# Patient Record
Sex: Male | Born: 1970 | Race: White | Hispanic: No | Marital: Single | Smoking: Never smoker
Health system: Southern US, Community
[De-identification: ages and names within clinical notes are randomized; demographics above are authoritative.]

## PROBLEM LIST (undated history)

## (undated) DIAGNOSIS — I10 Essential (primary) hypertension: Secondary | ICD-10-CM

## (undated) DIAGNOSIS — I2510 Atherosclerotic heart disease of native coronary artery without angina pectoris: Secondary | ICD-10-CM

## (undated) DIAGNOSIS — E785 Hyperlipidemia, unspecified: Secondary | ICD-10-CM

## (undated) HISTORY — PX: NASAL SEPTUM SURGERY: SHX37

## (undated) HISTORY — DX: Hyperlipidemia, unspecified: E78.5

## (undated) HISTORY — DX: Essential (primary) hypertension: I10

## (undated) HISTORY — PX: FRACTURE SURGERY: SHX138

---

## 2000-08-28 ENCOUNTER — Encounter: Payer: Self-pay | Admitting: Family Medicine

## 2000-08-28 ENCOUNTER — Encounter: Admission: RE | Admit: 2000-08-28 | Discharge: 2000-08-28 | Payer: Self-pay | Admitting: Family Medicine

## 2013-12-30 ENCOUNTER — Ambulatory Visit: Payer: Self-pay | Admitting: Cardiology

## 2014-01-16 ENCOUNTER — Encounter: Payer: Self-pay | Admitting: Cardiology

## 2014-01-16 ENCOUNTER — Encounter (INDEPENDENT_AMBULATORY_CARE_PROVIDER_SITE_OTHER): Payer: Self-pay

## 2014-01-16 ENCOUNTER — Ambulatory Visit (INDEPENDENT_AMBULATORY_CARE_PROVIDER_SITE_OTHER): Payer: Managed Care, Other (non HMO) | Admitting: Cardiology

## 2014-01-16 VITALS — BP 138/80 | HR 97 | Ht 66.0 in | Wt 155.0 lb

## 2014-01-16 DIAGNOSIS — I10 Essential (primary) hypertension: Secondary | ICD-10-CM

## 2014-01-16 NOTE — Patient Instructions (Signed)
Your physician recommends that you continue on your current medications as directed. Please refer to the Current Medication list given to you today.  Your physician recommends that you schedule a follow-up appointment in: 3 months follow-up with Dr. Anne FuSkains

## 2014-01-16 NOTE — Progress Notes (Signed)
[address] [place], [place]  [address] Phone: [phone] Fax:  [phone]  Date:  01/16/2014   ID:  Gary Castaneda, DOB May 01, 1971, MRN [id number]  PCP:  No primary provider on file.   History of Present Illness: Gary Castaneda is a 43 y.o. male patient of Carleene Overlie here for evaluation of difficult to control hypertension. He has been on several medications in the past including ACE inhibitors, diuretics he is no history of renal disease, coarctation, hyperaldosteronism, pheochromocytoma. I have been discussing his case with his primary provider. We decided to add Norvasc. Blood pressure has been severely elevated 216/102. His father had myocardial infarction at age 27. Stressful job., 3 children. Males die at age 41 he states. Has mild rash on back, torso, legs.   Bystolic - leg cramps.     Wt Readings from Last 3 Encounters:  01/16/14 155 lb (70.308 kg)     Past Medical History  Diagnosis Date  . HTN (hypertension)   . Hyperlipidemia LDL goal < 100     No past surgical history on file.  Current Outpatient Prescriptions  Medication Sig Dispense Refill  . amLODipine (NORVASC) 10 MG tablet Take 10 mg by mouth daily.      Marland Kitchen atorvastatin (LIPITOR) 10 MG tablet Take 10 mg by mouth daily.      Marland Kitchen lisinopril-hydrochlorothiazide (PRINZIDE,ZESTORETIC) 20-25 MG per tablet Take 1 tablet by mouth daily.      Marland Kitchen spironolactone (ALDACTONE) 25 MG tablet Take 25 mg by mouth daily.       No current facility-administered medications for this visit.    Allergies:   No Known Allergies  Social History:  The patient  reports that he has never smoked. He does not have any smokeless tobacco history on file. He reports that he does not drink alcohol or use illicit drugs. drinks on average 2-3 drinks daily. Works as a Dietitian. Grew up with Dani Gobble.  Family History  Problem Relation Age of Onset  . Hypertension    . Diabetes     . Kidney disease Father   . Heart attack    . Stroke      ROS:  Please see the history of present illness.   Denies any strokelike symptoms, fevers, syncope, severe headaches, visual disturbance, shortness of breath, chest pain, orthopnea, PND. Positive rash. Has a history of psoriasis.  All other systems reviewed and negative.   PHYSICAL EXAM: VS:  BP 138/80  Pulse 97  Ht 5\' 6"  (1.676 m)  Wt 155 lb (70.308 kg)  BMI 25.03 kg/m2 Well nourished, well developed, in no acute distress HEENT: normal, [place]/AT, EOMI Neck: no JVD, normal carotid upstroke, no bruit Cardiac:  normal S1, S2; RRR; no murmur Lungs:  clear to auscultation bilaterally, no wheezing, rhonchi or rales Abd: soft, nontender, no hepatomegaly, no bruits Ext: no edema, 2+ distal pulses Skin: warm and drypatchy rash.  GU: deferred Neuro: no focal abnormalities noted, AAO x 3  EKG:  Sinus rhythm, rate 97 no other abnormalities.     ASSESSMENT AND PLAN:  1. Uncontrolled hypertension-obviously uncontrolled hypertension put him at risk for stroke, heart attack, renal impairment. Difficult to control. His father has a history of myocardial infarction. Currently, blood pressure is improved after addition of amlodipine and spironolactone. Excellent. Continue with current plan. History does not provide enough evidence that this is pheochromocytoma which is very rare. Also, since blood pressure is  controlled, I do not feel strongly at this time that a renal duplex ultrasound is necessary. If we begin to see elevated blood pressures once again, I will have low threshold for ordering renal duplex. Continue with low-salt diet. Exercise. Stress reduction. 2. I will see him back in 3 months for followup. We will continue to closely monitor. He states that he did his blood pressure checked periodically at [facility].  Signed, Donato SchultzMark Omran Keelin, MD [facility]  01/16/2014 11:44 AM

## 2014-04-15 ENCOUNTER — Ambulatory Visit (INDEPENDENT_AMBULATORY_CARE_PROVIDER_SITE_OTHER): Payer: Managed Care, Other (non HMO) | Admitting: Cardiology

## 2014-04-15 ENCOUNTER — Encounter: Payer: Self-pay | Admitting: Cardiology

## 2014-04-15 VITALS — BP 124/76 | HR 100 | Ht 66.0 in | Wt 163.8 lb

## 2014-04-15 DIAGNOSIS — I10 Essential (primary) hypertension: Secondary | ICD-10-CM

## 2014-04-15 NOTE — Patient Instructions (Signed)
Your physician recommends that you continue on your current medications as directed. Please refer to the Current Medication list given to you today.  Your physician wants you to follow-up in: 1 year. You will receive a reminder letter in the mail two months in advance. If you don't receive a letter, please call our office to schedule the follow-up appointment.  

## 2014-04-15 NOTE — Progress Notes (Signed)
[address] [place], [place]  [address] Phone: [phone] Fax:  [phone]  Date:  04/15/2014   ID:  Gary MaiBrian E Castaneda, DOB 24-Mar-1971, MRN [id number]  PCP:  Teena IraniSPENCER,[name], PA-C   History of Present Illness: Gary Castaneda is a 43 y.o. male patient of Carleene OverlieSara Carter [name]-PA here for follow up of difficult to control hypertension currently doing much better. Norvasc. On spironolactone, ACE inhibitor, HCTZ.  Blood pressure has been severely elevated 216/102 in the past. His father had myocardial infarction at age 41. Stressful job., 3 children. Males die at age 41 he states. Has mild rash on back, torso, legs.   Bystolic - leg cramps.  Wt Readings from Last 3 Encounters:  04/15/14 163 lb 12.8 oz (74.299 kg)  01/16/14 155 lb (70.308 kg)     Past Medical History  Diagnosis Date  . HTN (hypertension)   . Hyperlipidemia LDL goal < 100     No past surgical history on file.  Current Outpatient Prescriptions  Medication Sig Dispense Refill  . amLODipine (NORVASC) 10 MG tablet Take 10 mg by mouth daily.      Marland Kitchen. atorvastatin (LIPITOR) 10 MG tablet Take 10 mg by mouth daily.      Marland Kitchen. lisinopril-hydrochlorothiazide (PRINZIDE,ZESTORETIC) 20-25 MG per tablet Take 1 tablet by mouth daily.      Marland Kitchen. spironolactone (ALDACTONE) 25 MG tablet Take 25 mg by mouth daily.       No current facility-administered medications for this visit.    Allergies:   No Known Allergies  Social History:  The patient  reports that he has never smoked. He does not have any smokeless tobacco history on file. He reports that he does not drink alcohol or use illicit drugs. drinks on average 2-3 drinks daily. Works as a Dietitianfinancial advisor/broker. Grew up with [name].  Family History  Problem Relation Age of Onset  . Hypertension    . Diabetes    . Kidney disease Father   . Heart attack    . Stroke      ROS:  Please see the history of present illness.   Denies any strokelike  symptoms, fevers, syncope, severe headaches, visual disturbance, shortness of breath, chest pain, orthopnea, PND. Positive rash. Has a history of psoriasis.  All other systems reviewed and negative.   PHYSICAL EXAM: VS:  BP 124/76  Pulse 100  Ht 5\' 6"  (1.676 m)  Wt 163 lb 12.8 oz (74.299 kg)  BMI 26.45 kg/m2 Well nourished, well developed, in no acute distress HEENT: normal, [place]/AT, EOMI Neck: no JVD, normal carotid upstroke, no bruit Cardiac:  normal S1, S2; RRR; no murmur Lungs:  clear to auscultation bilaterally, no wheezing, rhonchi or rales Abd: soft, nontender, no hepatomegaly, no bruits Ext: no edema, 2+ distal pulses Skin: warm and drypatchy rash.  GU: deferred Neuro: no focal abnormalities noted, AAO x 3  EKG:  Sinus rhythm, rate 97 no other abnormalities.     ASSESSMENT AND PLAN:  1. Uncontrolled hypertension-He is much improved currently. Doing very well on current regimen as above. No changes made. Spironolactone may have been the ultimate reason for further reduction. I do one him to check blood work to make sure that his renal function, potassium are within normal range. If blood pressure remains excellent, one could consider continuation of current medication regimen or potentially cutting amlodipine in half. Continue to monitor. Obviously uncontrolled hypertension put him at risk for stroke, heart  attack, renal impairment. Previously difficult to control. His father has a history of myocardial infarction. Currently, blood pressure is improved after addition of amlodipine and spironolactone. Excellent. Continue with current plan. History does not provide enough evidence that this is pheochromocytoma which is very rare. Also, since blood pressure is controlled, I do not feel strongly at this time that a renal duplex ultrasound is necessary. If we begin to see elevated blood pressures once again, I will have low threshold for ordering renal duplex. Continue with low-salt diet.  Exercise. Stress reduction. 2. I will see him back in 3 months for followup. We will continue to closely monitor. He states that he did his blood pressure checked periodically at [employer] house.  Signed, Donato SchultzMark Freja Faro, MD [facility]  04/15/2014 10:48 AM

## 2017-01-08 ENCOUNTER — Telehealth: Payer: Self-pay

## 2017-01-08 NOTE — Telephone Encounter (Signed)
SENT NOTES TO SCHEDULING 

## 2017-04-11 ENCOUNTER — Encounter: Payer: Self-pay | Admitting: Cardiology

## 2017-04-11 ENCOUNTER — Encounter (INDEPENDENT_AMBULATORY_CARE_PROVIDER_SITE_OTHER): Payer: Self-pay

## 2017-04-11 ENCOUNTER — Ambulatory Visit (INDEPENDENT_AMBULATORY_CARE_PROVIDER_SITE_OTHER): Payer: BLUE CROSS/BLUE SHIELD | Admitting: Cardiology

## 2017-04-11 VITALS — BP 142/90 | HR 88 | Ht 66.0 in | Wt 172.6 lb

## 2017-04-11 DIAGNOSIS — I10 Essential (primary) hypertension: Secondary | ICD-10-CM

## 2017-04-11 MED ORDER — NEBIVOLOL HCL 5 MG PO TABS: 5.0000 mg | ORAL_TABLET | Freq: Every day | ORAL | 11 refills | Status: DC

## 2017-04-11 MED ORDER — NEBIVOLOL HCL 5 MG PO TABS: 5.0000 mg | ORAL_TABLET | Freq: Every day | ORAL | 3 refills | Status: DC

## 2017-04-11 NOTE — Patient Instructions (Signed)
Medication Instructions:  START Bystolic 5 mg once daily   Labwork: None Ordered   Testing/Procedures: None Ordered   Follow-Up: Your physician wants you to follow-up in: 6 months with Dr. Anne FuSkains.  You will receive a reminder letter in the mail two months in advance. If you don't receive a letter, please call our office to schedule the follow-up appointment.   If you need a refill on your cardiac medications before your next appointment, please call your pharmacy.   Thank you for choosing [facility]! Eligha BridegroomMichelle Gwynneth Fabio, RN [phone]

## 2017-04-11 NOTE — Progress Notes (Signed)
[address] [place], [place]  [address] Phone: [phone] Fax:  [phone]  Date:  04/11/2017   ID:  Gary Castaneda, DOB Sep 18, 1971, MRN [id number]  PCP:  Teena Irani, PA-C   History of Present Illness: Gary Castaneda is a 46 y.o. male patient of Carleene Overlie here for follow up of difficult to control hypertension currently doing much better. Norvasc. On spironolactone, ACE inhibitor, HCTZ.  Blood pressure has been severely elevated 216/102 in the past. His father had myocardial infarction at age 57. Recently died in his 39s. Stressful job., 3 children. Males die at age 95 he states.   Father died in 2024/10/192024/02/20 - edema in leg, amlodipine 10 to 5 improved. Felt terrible, no energy, tired. 160/90. May 4 - went off spirolactone. Stopped. 140/90. Feeling a little bit better.  We will try Bystolic.  Wt Readings from Last 3 Encounters:  04/11/17 172 lb 9.6 oz (78.3 kg)  04/15/14 163 lb 12.8 oz (74.3 kg)  01/16/14 155 lb (70.3 kg)     Past Medical History:  Diagnosis Date  . HTN (hypertension)   . Hyperlipidemia LDL goal < 100     No past surgical history on file.  Current Outpatient Prescriptions  Medication Sig Dispense Refill  . amLODipine (NORVASC) 5 MG tablet Take 5 mg by mouth daily.    Marland Kitchen lisinopril-hydrochlorothiazide (PRINZIDE,ZESTORETIC) 20-25 MG per tablet Take 1 tablet by mouth daily.    . rosuvastatin (CRESTOR) 10 MG tablet Take 10 mg by mouth daily.    . nebivolol (BYSTOLIC) 5 MG tablet Take 1 tablet (5 mg total) by mouth daily. 90 tablet 3   No current facility-administered medications for this visit.     Allergies:   No Known Allergies  Social History:  The patient  reports that he has never smoked. He has never used smokeless tobacco. He reports that he does not drink alcohol or use drugs. drinks on average 2-3 drinks daily. Works as a Dietitian. Grew up with Dani Gobble.  Family History    Problem Relation Age of Onset  . Hypertension Unknown   . Diabetes Unknown   . Kidney disease Father   . Heart attack Unknown   . Stroke Unknown     ROS:  Please see the history of present illness.   Denies any strokelike symptoms, fevers, syncope, severe headaches, visual disturbance, shortness of breath, chest pain, orthopnea, PND. Positive rash. Has a history of psoriasis.  All other systems reviewed and negative.   PHYSICAL EXAM: VS:  BP (!) 142/90 (BP Location: Right Arm, Patient Position: Sitting, Cuff Size: Normal)   Pulse 88   Ht 5\' 6"  (1.676 m)   Wt 172 lb 9.6 oz (78.3 kg)   SpO2 99%   BMI 27.86 kg/m  Well nourished, well developed, in no acute distress  HEENT: normal, [place]/AT, EOMI Neck: no JVD, normal carotid upstroke, no bruit Cardiac:  normal S1, S2; RRR; no murmur  Lungs:  clear to auscultation bilaterally, no wheezing, rhonchi or rales  Abd: soft, nontender, no hepatomegaly, no bruits  Ext: no edema, 2+ distal pulses Skin: warm and dry patchy rash.  GU: deferred Neuro: no focal abnormalities noted, AAO x 3  EKG:  04/11/17-sinus rhythm no other abnormalities personally viewed-prior Sinus rhythm, rate 97 no other abnormalities.     ASSESSMENT AND PLAN:  Essential hypertension  - Recently stopped his spironolactone. He feels like it worked  well for about 3 years none now no longer is working. He felt a lack of energy recently in February.  - Since decreasing the amlodipine his edema has resolved. He's not complaining of any chest pain or shortness of breath. We talked about the possibility of stress testing or echocardiogram but at this time we will add Bystolic 5 mg once a day. Likely this will help with his overall blood pressures. We will give him samples.  - He is getting his blood pressure checked quite frequently.  Family history of CAD  - Father recently died in his 7570s.  - Continuing with prevention strategies.  I like to get him back in 6  months.  Signed, Donato SchultzMark Skains, MD [facility]  04/11/2017 4:02 PM

## 2017-12-20 ENCOUNTER — Ambulatory Visit: Payer: BLUE CROSS/BLUE SHIELD | Admitting: Cardiology

## 2018-01-17 ENCOUNTER — Ambulatory Visit (INDEPENDENT_AMBULATORY_CARE_PROVIDER_SITE_OTHER): Payer: BLUE CROSS/BLUE SHIELD | Admitting: Cardiology

## 2018-01-17 ENCOUNTER — Encounter: Payer: Self-pay | Admitting: Cardiology

## 2018-01-17 VITALS — BP 134/86 | HR 80 | Ht 66.0 in | Wt 160.1 lb

## 2018-01-17 DIAGNOSIS — Z8249 Family history of ischemic heart disease and other diseases of the circulatory system: Secondary | ICD-10-CM

## 2018-01-17 DIAGNOSIS — I10 Essential (primary) hypertension: Secondary | ICD-10-CM

## 2018-01-17 NOTE — Progress Notes (Signed)
[address] [place], [place]  [address] Phone: [phone] Fax:  [phone]  Date:  01/17/2018   ID:  Gary Castaneda, DOB 02-Feb-1971, MRN [id number]  PCP:  Teena Irani, PA-C   History of Present Illness: Gary Castaneda is a 47 y.o. male patient of Carleene Overlie here for follow up of difficult to control hypertension currently doing much better. Norvasc. On spironolactone, ACE inhibitor, HCTZ.  Blood pressure has been severely elevated 216/102 in the past. His father had myocardial infarction at age 16. Recently died in his 43s. Stressful job., 3 children. Males die at age 48 he states.   Father died in Sep 19, 2023. Age 57 MI, multiple family members.  2/18 - edema in leg, amlodipine 10 to 5 improved. Felt terrible, no energy, tired. 160/90. May 4 - went off spirolactone. Stopped. 140/90. Feeling a little bit better.  Bystolic  01/17/18 - Overall doing well. No CP, no SOB. Has neck psoriasis. Taking meds well. See below. Long discussion,.   Wt Readings from Last 3 Encounters:  01/17/18 160 lb 1.9 oz (72.6 kg)  04/11/17 172 lb 9.6 oz (78.3 kg)  04/15/14 163 lb 12.8 oz (74.3 kg)     Past Medical History:  Diagnosis Date  . HTN (hypertension)   . Hyperlipidemia LDL goal < 100     History reviewed. No pertinent surgical history.  Current Outpatient Medications  Medication Sig Dispense Refill  . amLODipine (NORVASC) 5 MG tablet Take 5 mg by mouth daily.    Marland Kitchen lisinopril-hydrochlorothiazide (PRINZIDE,ZESTORETIC) 20-25 MG per tablet Take 1 tablet by mouth daily.    . nebivolol (BYSTOLIC) 5 MG tablet Take 1 tablet (5 mg total) by mouth daily. 90 tablet 3  . rosuvastatin (CRESTOR) 10 MG tablet Take 10 mg by mouth daily.     No current facility-administered medications for this visit.     Allergies:   No Known Allergies  Social History:  The patient  reports that  has never smoked. he has never used smokeless tobacco. He reports that he does not  drink alcohol or use drugs. drinks on average 2-3 drinks daily. Works as a Dietitian. Grew up with Dani Gobble.  Family History  Problem Relation Age of Onset  . Kidney disease Father   . Hypertension Unknown   . Diabetes Unknown   . Heart attack Unknown   . Stroke Unknown     ROS:  Please see the history of present illness.    Has a history of psoriasis. All other ROS neg   PHYSICAL EXAM: VS:  BP 134/86   Pulse 80   Ht 5\' 6"  (1.676 m)   Wt 160 lb 1.9 oz (72.6 kg)   BMI 25.84 kg/m  GEN: Well nourished, well developed, in no acute distress  HEENT: normal  Neck: no JVD, carotid bruits, or masses Cardiac: RRR; no murmurs, rubs, or gallops,no edema  Respiratory:  clear to auscultation bilaterally, normal work of breathing GI: soft, nontender, nondistended, + BS MS: no deformity or atrophy  Skin: warm and dry, no rash Neuro:  Alert and Oriented x 3, Strength and sensation are intact Psych: euthymic mood, full affect   EKG:  04/11/17-sinus rhythm no other abnormalities personally viewed-prior Sinus rhythm, rate 97 no other abnormalities.     ASSESSMENT AND PLAN:  Essential hypertension  - Stopped spironolactone. He feels like it worked well for about 3 years none now no longer is working.   -  Since decreasing the amlodipine his edema has resolved. Continue with Ace/hct combo and Bystolic.   Family history of CAD  - Father recently died in his 3570s. Had MI at 3154. He feels he is going down that path. Dr. Katrinka BlazingSmith took care of his father. He does not know anything about his medical history details.   - Continuing with prevention strategies.  - I will obtain calcium score. If high, stress test.   25 min discussion in clinic.   I like to get him back in 12 months.  Signed, Donato SchultzMark Calia Napp, MD [facility]  01/17/2018 5:19 PM

## 2018-01-17 NOTE — Patient Instructions (Signed)
Medication Instructions:  The current medical regimen is effective;  continue present plan and medications.  Testing/Procedures: Your physician has requested that you have Calcium score. Cardiac computed tomography (CT) is a painless test that uses an x-ray machine to take clear, detailed pictures of your heart. For further information please visit [email or web address].   Follow-Up: Follow up in 1 year with Dr. Anne FuSkains.  You will receive a letter in the mail 2 months before you are due.  Please call us when you receive this letter to schedule your follow up appointment.  If you need a refill on your cardiac medications before your next appointment, please call your pharmacy.  Thank you for choosing [facility]!!

## 2018-05-15 ENCOUNTER — Other Ambulatory Visit: Payer: Self-pay | Admitting: Cardiology

## 2018-06-27 ENCOUNTER — Telehealth: Payer: Self-pay | Admitting: *Deleted

## 2018-06-27 DIAGNOSIS — I10 Essential (primary) hypertension: Secondary | ICD-10-CM

## 2018-06-27 DIAGNOSIS — Z8249 Family history of ischemic heart disease and other diseases of the circulatory system: Secondary | ICD-10-CM

## 2018-06-27 NOTE — Telephone Encounter (Signed)
-----   Message from [place] CitizenStacey J Snead, [place] sent at 06/27/2018  9:47 AM EDT ----- Regarding: CT CA Score Pam   Pt wants to have CT CA score order has expired Can you put in a new order?  Thanks  Visteon CorporationStacey

## 2018-07-19 ENCOUNTER — Ambulatory Visit (INDEPENDENT_AMBULATORY_CARE_PROVIDER_SITE_OTHER)
Admission: RE | Admit: 2018-07-19 | Discharge: 2018-07-19 | Disposition: A | Discharge status: Home / Self Care | Payer: Self-pay | Source: Ambulatory Visit | Attending: Cardiology | Admitting: Cardiology

## 2018-07-19 DIAGNOSIS — Z8249 Family history of ischemic heart disease and other diseases of the circulatory system: Secondary | ICD-10-CM

## 2018-07-19 DIAGNOSIS — I10 Essential (primary) hypertension: Secondary | ICD-10-CM

## 2018-07-24 ENCOUNTER — Telehealth: Payer: Self-pay | Admitting: Cardiology

## 2018-07-24 DIAGNOSIS — I10 Essential (primary) hypertension: Secondary | ICD-10-CM

## 2018-07-24 DIAGNOSIS — Z8249 Family history of ischemic heart disease and other diseases of the circulatory system: Secondary | ICD-10-CM

## 2018-07-24 NOTE — Telephone Encounter (Signed)
Attempted to call pt back at number listed.  Left voicemail to call back to review results and recommendations.  Notes recorded by Jake BatheSkains, Mark C, MD on 07/23/2018 at 6:44 AM EDT High calcium score, 1130. Order NUC stress test. Increase Crestor to 20mg  PO QD Check lipids and ALT in 2 months.  Donato SchultzMark Skains, MD

## 2018-07-24 NOTE — Telephone Encounter (Signed)
New Message   Patient is returning call in reference to his CT results. Please call.

## 2018-07-25 ENCOUNTER — Telehealth: Payer: Self-pay | Admitting: Cardiology

## 2018-07-25 MED ORDER — ROSUVASTATIN CALCIUM 20 MG PO TABS: 20.0000 mg | ORAL_TABLET | Freq: Every day | ORAL | 3 refills | Status: DC

## 2018-07-25 NOTE — Telephone Encounter (Signed)
Spoke with the patient re: his Calcium score.. Advised him Dr. Anne FuSkains recommendations:   High calcium score, 1130. Order NUC stress test. Increase Crestor to 20mg  PO QD Check lipids and ALT in 2 months.   Rx sent in for the Crestor  Pt says he will have fasting Lipid and Liver in 2 months at Dr. Fortunato CurlingBadger's office where he says he normally has them and have them sent to our office.   Pt agrees to Nuc med Stress test and verbalized understanding of his instructions.

## 2018-07-25 NOTE — Telephone Encounter (Signed)
New message:      Pt is returning a call for results and pt states he is okay with you sending a mychart message with the results and if he has any questions he will call back

## 2018-07-30 ENCOUNTER — Telehealth (HOSPITAL_COMMUNITY): Payer: Self-pay | Admitting: *Deleted

## 2018-07-30 NOTE — Telephone Encounter (Signed)
Patient given detailed instructions per Myocardial Perfusion Study Information Sheet for the test on 08/01/18 at 1000. Patient notified to arrive 15 minutes early and that it is imperative to arrive on time for appointment to keep from having the test rescheduled.  If you need to cancel or reschedule your appointment, please call the office within 24 hours of your appointment. . Patient verbalized understanding.Laysha Childers, Adelene Idler

## 2018-08-01 ENCOUNTER — Ambulatory Visit (HOSPITAL_COMMUNITY): Payer: BLUE CROSS/BLUE SHIELD | Attending: Cardiology

## 2018-08-01 DIAGNOSIS — Z8249 Family history of ischemic heart disease and other diseases of the circulatory system: Secondary | ICD-10-CM

## 2018-08-01 DIAGNOSIS — I2510 Atherosclerotic heart disease of native coronary artery without angina pectoris: Secondary | ICD-10-CM | POA: Insufficient documentation

## 2018-08-01 DIAGNOSIS — I10 Essential (primary) hypertension: Secondary | ICD-10-CM | POA: Diagnosis not present

## 2018-08-01 LAB — MYOCARDIAL PERFUSION IMAGING
CHL CUP MPHR: 175 {beats}/min
CHL CUP NUCLEAR SDS: 1
CHL CUP NUCLEAR SRS: 2
Estimated workload: 13.4 METS
Exercise duration (min): 12 min
Exercise duration (sec): 0 s
LVDIAVOL: 86 mL (ref 62–150)
LVSYSVOL: 25 mL
NUC STRESS TID: 0.89
Peak HR: 160 {beats}/min
Percent HR: 91 %
Rest HR: 72 {beats}/min
SSS: 4

## 2018-08-01 MED ORDER — TECHNETIUM TC 99M TETROFOSMIN IV KIT
31.7000 | PACK | Freq: Once | INTRAVENOUS | Status: AC | PRN
  Administered 2018-08-01: 31.7 via INTRAVENOUS
  Filled 2018-08-01: qty 32

## 2018-08-01 MED ORDER — TECHNETIUM TC 99M TETROFOSMIN IV KIT
9.5000 | PACK | Freq: Once | INTRAVENOUS | Status: AC | PRN
  Administered 2018-08-01: 9.5 via INTRAVENOUS
  Filled 2018-08-01: qty 10

## 2019-01-27 ENCOUNTER — Other Ambulatory Visit: Payer: Self-pay | Admitting: Cardiology

## 2019-03-31 ENCOUNTER — Ambulatory Visit: Payer: BLUE CROSS/BLUE SHIELD | Admitting: Cardiology

## 2019-04-07 ENCOUNTER — Other Ambulatory Visit: Payer: Self-pay

## 2019-04-07 ENCOUNTER — Encounter: Payer: Self-pay | Admitting: Cardiology

## 2019-04-07 ENCOUNTER — Telehealth: Payer: Self-pay | Admitting: Cardiology

## 2019-04-07 ENCOUNTER — Telehealth (INDEPENDENT_AMBULATORY_CARE_PROVIDER_SITE_OTHER): Payer: BLUE CROSS/BLUE SHIELD | Admitting: Cardiology

## 2019-04-07 VITALS — BP 126/85 | Ht 66.0 in | Wt 148.0 lb

## 2019-04-07 DIAGNOSIS — I2584 Coronary atherosclerosis due to calcified coronary lesion: Secondary | ICD-10-CM

## 2019-04-07 DIAGNOSIS — I2510 Atherosclerotic heart disease of native coronary artery without angina pectoris: Secondary | ICD-10-CM | POA: Diagnosis not present

## 2019-04-07 DIAGNOSIS — I10 Essential (primary) hypertension: Secondary | ICD-10-CM

## 2019-04-07 DIAGNOSIS — E782 Mixed hyperlipidemia: Secondary | ICD-10-CM

## 2019-04-07 DIAGNOSIS — Z8249 Family history of ischemic heart disease and other diseases of the circulatory system: Secondary | ICD-10-CM

## 2019-04-07 MED ORDER — NEBIVOLOL HCL 5 MG PO TABS: 5.0000 mg | ORAL_TABLET | Freq: Every day | ORAL | 3 refills | Status: DC

## 2019-04-07 NOTE — Telephone Encounter (Signed)
Visit Completed.

## 2019-04-07 NOTE — Telephone Encounter (Signed)
New message:   Patient got disconnected from his phone call for his appt at 11 am today.Gary Castaneda

## 2019-04-07 NOTE — Patient Instructions (Signed)
Medication Instructions:  Your physician recommends that you continue on your current medications as directed. Please refer to the Current Medication list given to you today.  If you need a refill on your cardiac medications before your next appointment, please call your pharmacy.   Lab work: None ordered  If you have labs (blood work) drawn today and your tests are completely normal, you will receive your results only by: . MyChart Message (if you have MyChart) OR . A paper copy in the mail If you have any lab test that is abnormal or we need to change your treatment, we will call you to review the results.  Testing/Procedures: None ordered  Follow-Up: At [facility], you and your health needs are our priority.  As part of our continuing mission to provide you with exceptional heart care, we have created designated Provider Care Teams.  These Care Teams include your primary Cardiologist (physician) and Advanced Practice Providers (APPs -  Physician Assistants and Nurse Practitioners) who all work together to provide you with the care you need, when you need it. You will need a follow up appointment in 12 months.  Please call our office 2 months in advance to schedule this appointment.  You may see Mark Skains, MD or one of the following Advanced Practice Providers on your designated Care Team:   Lori Gerhardt, NP Laura Ingold, NP . Jill McDaniel, NP  Any Other Special Instructions Will Be Listed Below (If Applicable).    

## 2019-04-07 NOTE — Telephone Encounter (Signed)
Patient had visit  

## 2019-04-07 NOTE — Progress Notes (Signed)
Virtual Visit via Telephone Note   This visit type was conducted due to national recommendations for restrictions regarding the COVID-19 Pandemic (e.g. social distancing) in an effort to limit this patient's exposure and mitigate transmission in our community.  Due to his co-morbid illnesses, this patient is at least at moderate risk for complications without adequate follow up.  This format is felt to be most appropriate for this patient at this time.  The patient did not have access to video technology/had technical difficulties with video requiring transitioning to audio format only (telephone).  All issues noted in this document were discussed and addressed.  No physical exam could be performed with this format.  Please refer to the patient's chart for his  consent to telehealth for [facility].   Date:  04/07/2019   ID:  Gary Castaneda, DOB [birth year], MRN [id number]  Patient Location: Home Provider Location: Home  PCP:  Teena Irani, PA-C  Cardiologist:  No primary care provider on file. [place] Electrophysiologist:  None   Evaluation Performed:  Follow-Up Visit  Chief Complaint: Coronary calcification, hypertension  History of Present Illness:    Gary Castaneda is a 48 y.o. male with difficult to control hypertension at times, coronary artery calcification multivessel, no ischemia on nuclear stress test, hyperlipidemia here for follow-up.  Blood pressure 138/84 at last visit on 04/25/2018.  Males die at age 55 he states in his family.  His father died in 2017/10/20at age 89.  He had challenges with several blood pressure medications.  Lost weight during a viral illness in November 2019.  Overall he has been doing quite well.  No chest pain no anginal symptoms no shortness of breath.  Taking his medications.  [employer] building next to the [employer] is where he worked, went home 3 weeks before the mandate for COVID-19.   The patient does not have symptoms concerning  for COVID-19 infection (fever, chills, cough, or new shortness of breath).    Past Medical History:  Diagnosis Date  . HTN (hypertension)   . Hyperlipidemia LDL goal < 100    No past surgical history on file.   Current Meds  Medication Sig  . amLODipine (NORVASC) 5 MG tablet Take 5 mg by mouth daily.  Marland Kitchen aspirin EC 81 MG tablet Take 81 mg by mouth daily.  Marland Kitchen lisinopril (ZESTRIL) 20 MG tablet Take 20 mg by mouth daily.  . nebivolol (BYSTOLIC) 5 MG tablet Take 1 tablet (5 mg total) by mouth daily.  . rosuvastatin (CRESTOR) 20 MG tablet Take 1 tablet (20 mg total) by mouth daily.  . [DISCONTINUED] nebivolol (BYSTOLIC) 5 MG tablet Take 1 tablet (5 mg total) by mouth daily. Please call and schedule an appt for further refills. 1st attempt     Allergies:   Patient has no known allergies.   Social History   Tobacco Use  . Smoking status: Never Smoker  . Smokeless tobacco: Never Used  Substance Use Topics  . Alcohol use: No  . Drug use: No     Family Hx: The patient's family history includes Diabetes in his unknown relative; Heart attack in his unknown relative; Hypertension in his unknown relative; Kidney disease in his father; Stroke in his unknown relative.  ROS:   Please see the history of present illness.     All other systems reviewed and are negative.   Prior CV studies:   The following studies were reviewed today:  Nuclear stress test excellent,  he asked about blunting blood pressure response during exercise.  07/19/18 - CT calcium score 1130, this was in all 3 vessels.  Crestor increased to 20 mg once a day.  Labs/Other Tests and Data Reviewed:    EKG:  An ECG dated 04/14/17 was personally reviewed today and demonstrated:  Normal sinus rhythm no other changes  Recent Labs: No results found for requested labs within last 8760 hours.   Recent Lipid Panel No results found for: CHOL, TRIG, HDL, CHOLHDL, LDLCALC, LDLDIRECT  Wt Readings from Last 3 Encounters:   04/07/19 148 lb (67.1 kg)  08/01/18 160 lb (72.6 kg)  01/17/18 160 lb 1.9 oz (72.6 kg)     Objective:    Vital Signs:  BP 126/85   Ht 5\' 6"  (1.676 m)   Wt 148 lb (67.1 kg)   BMI 23.89 kg/m    VITAL SIGNS:  reviewed  ASSESSMENT & PLAN:    Coronary calcification - High calcium score, nuclear stress test without any ischemia -Continue with high intensity statin therapy Crestor 20.  Lipids have been checked Gary GobbleSara Carter [name], [place] office.  LDL 95, triglycerides 176, total cholesterol 180.  This was on 04/25/2018.  He is doing a very good job with aggressive secondary risk factor modification.  No symptoms.  Hyperlipidemia - Lipids as above.  High intensity statin.  Essential hypertension - Previously difficult to control.  Can increase in stressful situations.  Had some edema previously on higher dose amlodipine.  Has been on ACE inhibitor hydrochlorothiazide combination and Bystolic.  Previously stopped spironolactone.  Weight loss noted after a November about, viral illness.  He does have an [employer] Bluetooth blood pressure cuff at home.  He was asking me about this.  It would be helpful to have occasional readings, however I cautioned him on getting worried about an isolated high reading that could precipitate anxiety.  COVID-19 Education: The signs and symptoms of COVID-19 were discussed with the patient and how to seek care for testing (follow up with PCP or arrange E-visit).  The importance of social distancing was discussed today.  Time:   Today, I have spent 17 minutes with the patient with telehealth technology discussing the above problems.     Medication Adjustments/Labs and Tests Ordered: Current medicines are reviewed at length with the patient today.  Concerns regarding medicines are outlined above.   Tests Ordered: No orders of the defined types were placed in this encounter.   Medication Changes: Meds ordered this encounter  Medications  . nebivolol (BYSTOLIC) 5  MG tablet    Sig: Take 1 tablet (5 mg total) by mouth daily.    Dispense:  90 tablet    Refill:  3    Disposition:  Follow up in 1 year(s)  Signed, Donato SchultzMark Skains, MD  04/07/2019 12:22 PM    [facility]

## 2019-04-07 NOTE — Telephone Encounter (Signed)
Follow up:   Patient states he got disconnected with a call concering his appt.

## 2019-09-04 ENCOUNTER — Other Ambulatory Visit: Payer: Self-pay | Admitting: Cardiology

## 2020-01-15 IMAGING — CT CT HEART SCORING
2 series · 16 of 20 positions shown, 18 images · non-contrast
Comparison: None.

CLINICAL DATA: Risk stratification

EXAM:
Coronary Calcium Score
TECHNIQUE: The patient was scanned on a Siemens Somatom 64 slice scanner. Axial
non-contrast 3 mm slices were carried out through the heart. The
data set was analyzed on a dedicated work station and scored using
the Agatson method.

[Series 3: casc 3.0 i36f 2 bestdiast 68 % · axial · 0.40mm/px · z∈[+833,+938]mm · 8 of 47 slices shown, 10 images]
[im 6/47  vessel]
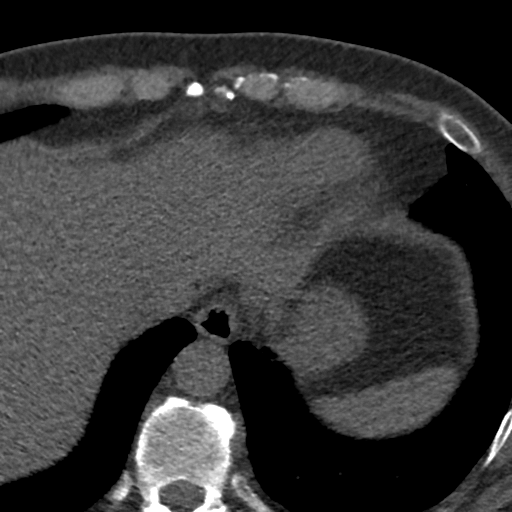
[im 6/47  lung]
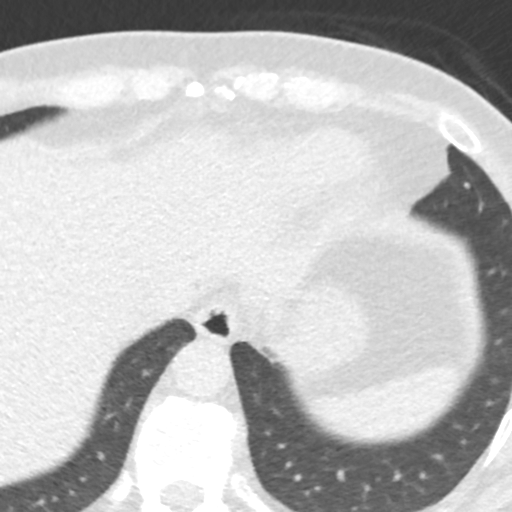
[im 11/47  vessel]
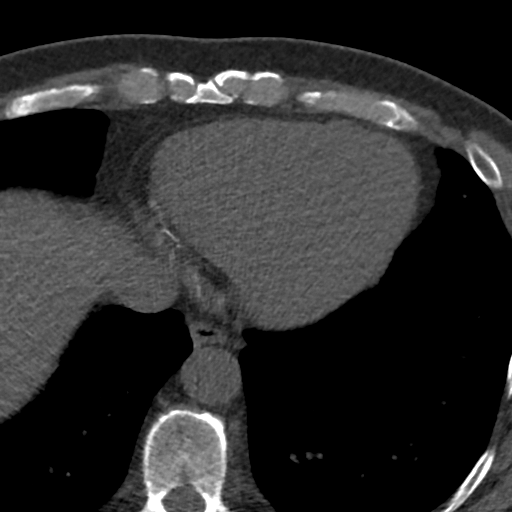
[im 16/47  vessel]
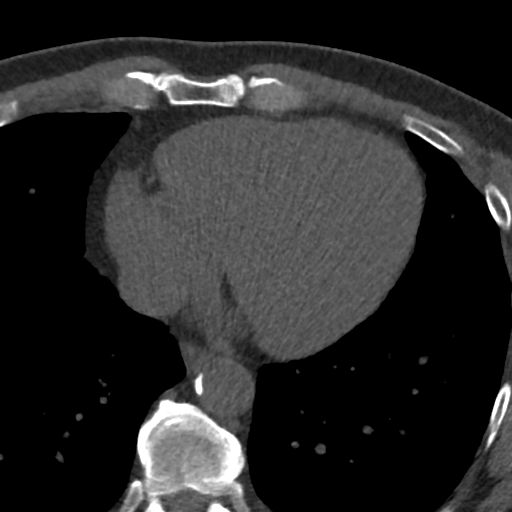
[im 21/47  vessel]
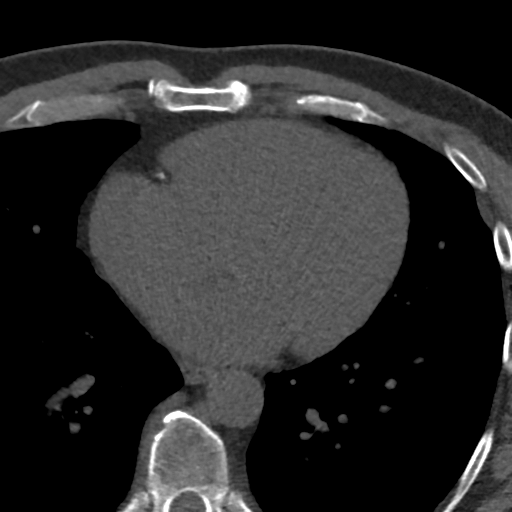
[im 26/47  vessel]
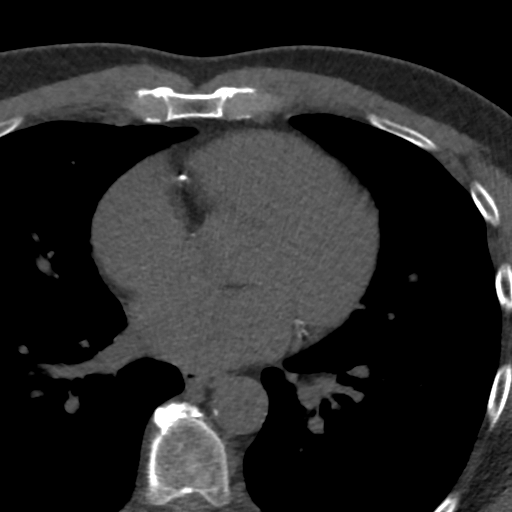
[im 26/47  lung]
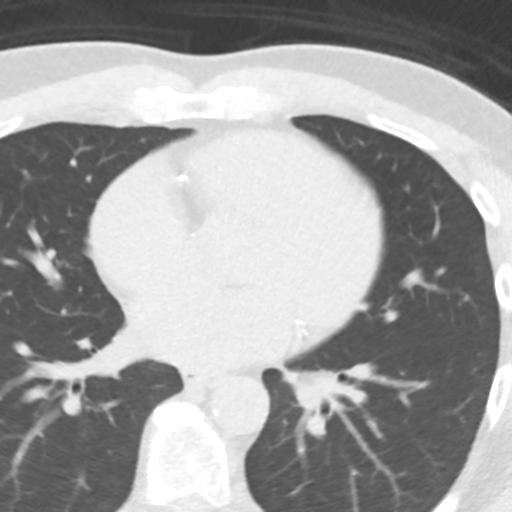
[im 31/47  vessel]
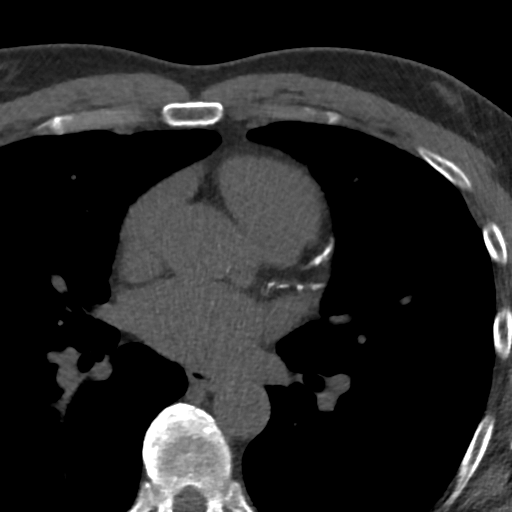
[im 36/47  vessel]
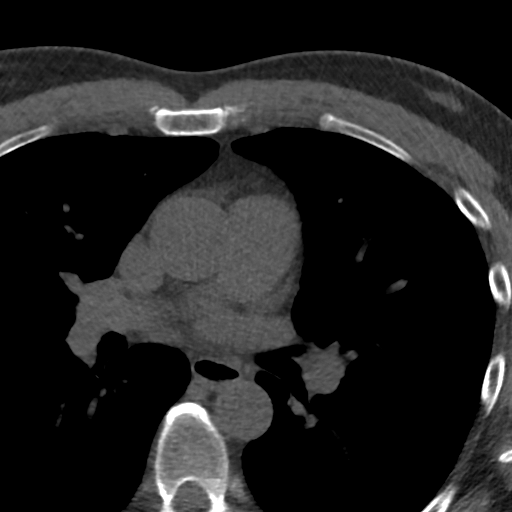
[im 41/47  vessel]
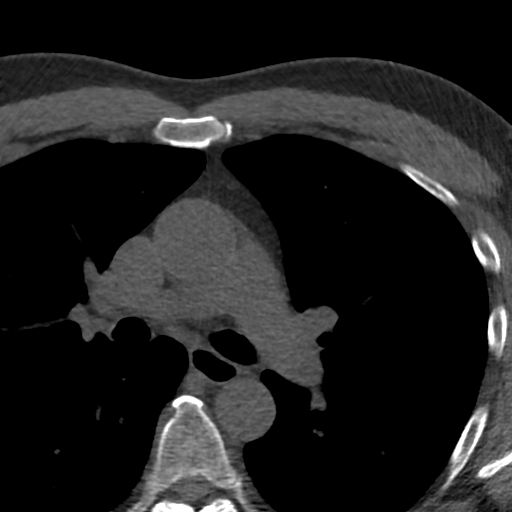

[Series 5: lung st 69 % · axial · 0.68mm/px · z∈[+822,+939]mm · 8 of 51 slices shown]
[im 6/51  lung]
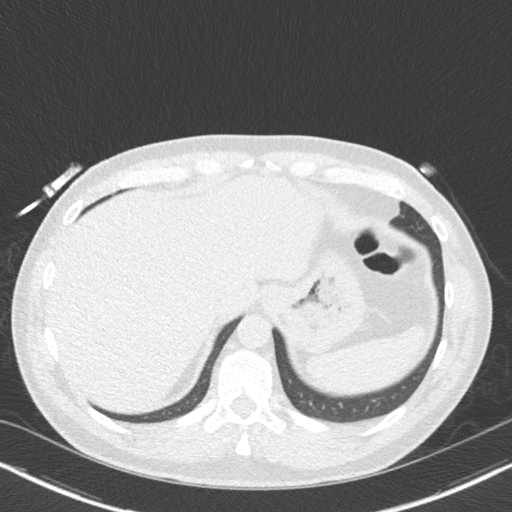
[im 12/51  lung]
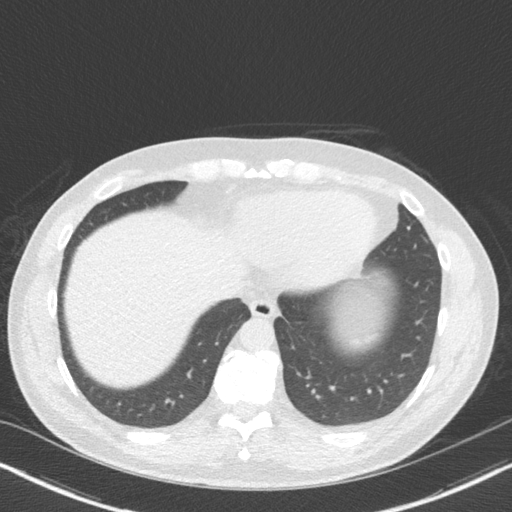
[im 17/51  lung]
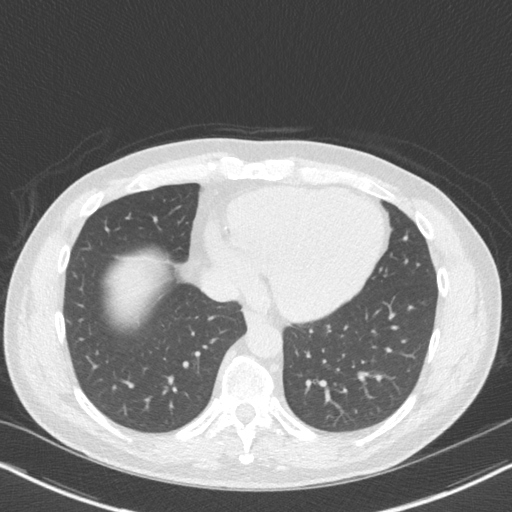
[im 23/51  lung]
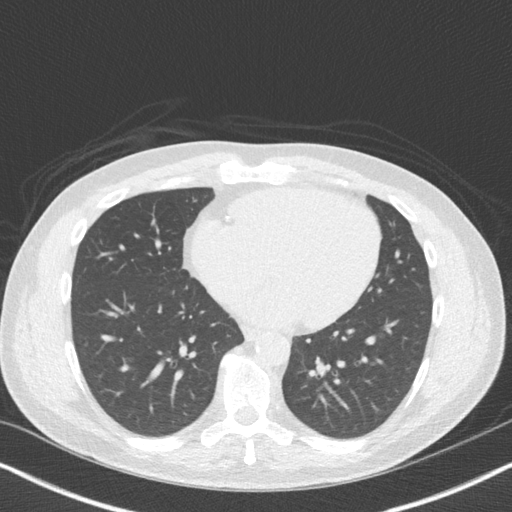
[im 28/51  lung]
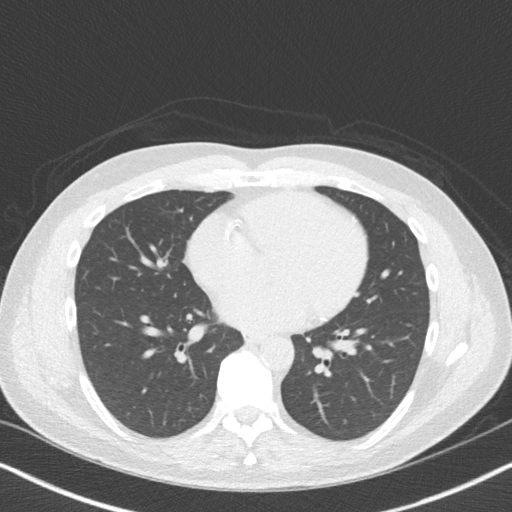
[im 34/51  lung]
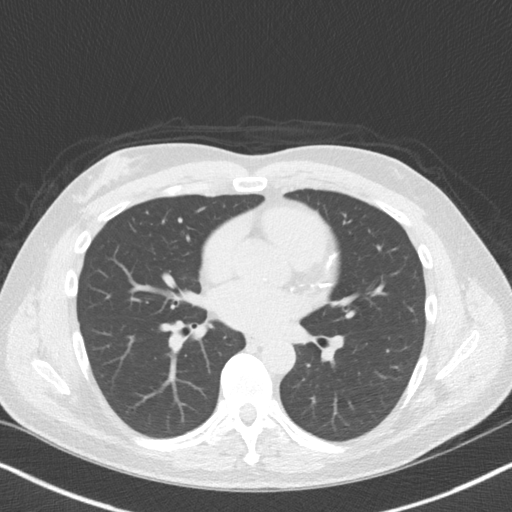
[im 39/51  lung]
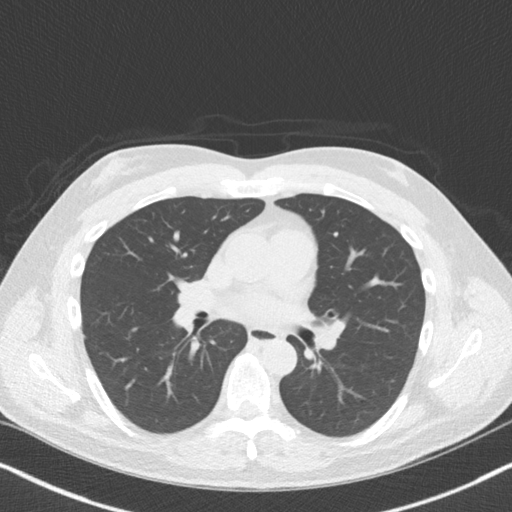
[im 45/51  lung]
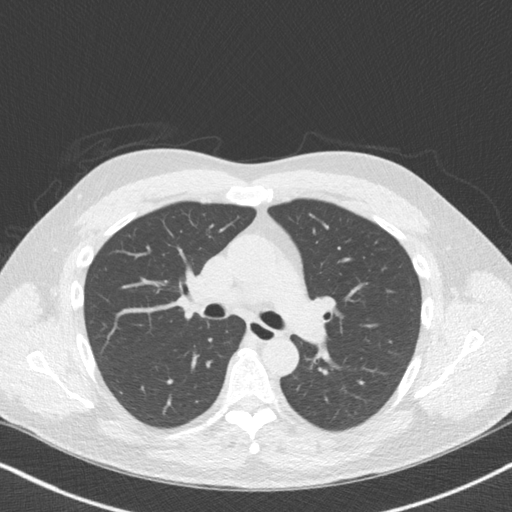

[16 of 20 positions shown; findings below may reference images not displayed]

FINDINGS: Non-cardiac: See separate report from [REDACTED].

Ascending Aorta: Normal diameter 3.3 cm

Pericardium: Normal

Coronary arteries: Marked 3 vessel coronary calcification
IMPRESSION: Coronary calcium score of 9967 . This was 99 th percentile for age
and sex matched control.

Consider f/u perfusion study given how high score is

Annamarie Abe

EXAM:
OVER-READ INTERPRETATION  CT CHEST

The following report is an over-read performed by radiologist Dr.
Naillah Belle [REDACTED] on 07/19/2018. This
over-read does not include interpretation of cardiac or coronary
anatomy or pathology. The coronary calcium score/coronary CTA
interpretation by the cardiologist is attached.
FINDINGS: Aortic atherosclerosis. Within the visualized portions of the thorax
there are no suspicious appearing pulmonary nodules or masses, there
is no acute consolidative airspace disease, no pleural effusions, no
pneumothorax and no lymphadenopathy. Visualized portions of the
upper abdomen are unremarkable. There are no aggressive appearing
lytic or blastic lesions noted in the visualized portions of the
skeleton.
IMPRESSION: Aortic Atherosclerosis ([id number]).

## 2020-04-09 ENCOUNTER — Other Ambulatory Visit: Payer: Self-pay | Admitting: Cardiology

## 2020-06-14 ENCOUNTER — Other Ambulatory Visit: Payer: Self-pay | Admitting: Cardiology

## 2020-06-24 ENCOUNTER — Ambulatory Visit (INDEPENDENT_AMBULATORY_CARE_PROVIDER_SITE_OTHER): Payer: BC Managed Care – PPO | Admitting: Cardiology

## 2020-06-24 ENCOUNTER — Other Ambulatory Visit: Payer: Self-pay

## 2020-06-24 ENCOUNTER — Encounter: Payer: Self-pay | Admitting: Cardiology

## 2020-06-24 VITALS — BP 146/80 | HR 92 | Ht 66.0 in | Wt 161.4 lb

## 2020-06-24 DIAGNOSIS — Z8249 Family history of ischemic heart disease and other diseases of the circulatory system: Secondary | ICD-10-CM | POA: Diagnosis not present

## 2020-06-24 DIAGNOSIS — I10 Essential (primary) hypertension: Secondary | ICD-10-CM

## 2020-06-24 DIAGNOSIS — I2584 Coronary atherosclerosis due to calcified coronary lesion: Secondary | ICD-10-CM

## 2020-06-24 DIAGNOSIS — E782 Mixed hyperlipidemia: Secondary | ICD-10-CM | POA: Diagnosis not present

## 2020-06-24 DIAGNOSIS — I2510 Atherosclerotic heart disease of native coronary artery without angina pectoris: Secondary | ICD-10-CM

## 2020-06-24 NOTE — Patient Instructions (Addendum)
Medication Instructions:  The current medical regimen is effective;  continue present plan and medications.  *If you need a refill on your cardiac medications before your next appointment, please call your pharmacy*  Follow-Up: At [facility], you and your health needs are our priority.  As part of our continuing mission to provide you with exceptional heart care, we have created designated Provider Care Teams.  These Care Teams include your primary Cardiologist (physician) and Advanced Practice Providers (APPs -  Physician Assistants and Nurse Practitioners) who all work together to provide you with the care you need, when you need it.  Your next appointment:   12 month(s)  The format for your next appointment:   In Person  Provider:   Mark Skains, MD   Thank you for choosing [facility]!!     

## 2020-06-24 NOTE — Progress Notes (Signed)
Cardiology Office Note:    Date:  06/24/2020   ID:  Gary Castaneda, DOB 1971/07/26, MRN [id number]  PCP:  Lucila Maine  [facility] Cardiologist:  No primary care provider on file.  [facility] Electrophysiologist:  None   Referring MD: Teena Irani, PA-C     History of Present Illness:    Gary Castaneda is a 49 y.o. male with difficult to control hypertension at times, coronary artery calcification multivessel, no ischemia on nuclear stress test, hyperlipidemia here for follow-up.  Blood pressure 138/84 at last visit on 04/25/2018.  Males die at age 44 he states in his family.  His father died in 10/19/2017at age 57.  He had challenges with several blood pressure medications.   Past Medical History:  Diagnosis Date  . HTN (hypertension)   . Hyperlipidemia LDL goal < 100     No past surgical history on file.  Current Medications: Current Meds  Medication Sig  . amLODipine (NORVASC) 5 MG tablet Take 5 mg by mouth daily.  Marland Kitchen aspirin EC 81 MG tablet Take 81 mg by mouth daily.  Marland Kitchen lisinopril (ZESTRIL) 20 MG tablet Take 20 mg by mouth daily.  . nebivolol (BYSTOLIC) 5 MG tablet Take 1 tablet (5 mg total) by mouth daily. Must schedule follow up appt for further refills [phone] 1st attempt  . rosuvastatin (CRESTOR) 20 MG tablet TAKE (1) TABLET DAILY AT BEDTIME.     Allergies:   Patient has no known allergies.   Social History   Socioeconomic History  . Marital status: Single    Spouse name: Not on file  . Number of children: Not on file  . Years of education: Not on file  . Highest education level: Not on file  Occupational History  . Not on file  Tobacco Use  . Smoking status: Never Smoker  . Smokeless tobacco: Never Used  Substance and Sexual Activity  . Alcohol use: No  . Drug use: No  . Sexual activity: Not on file  Other Topics Concern  . Not on file  Social History Narrative  . Not on file   Social Determinants of Health     Financial Resource Strain:   . Difficulty of Paying Living Expenses:   Food Insecurity:   . Worried About Programme researcher, broadcasting/film/video in the Last Year:   . Barista in the Last Year:   Transportation Needs:   . Freight forwarder (Medical):   Marland Kitchen Lack of Transportation (Non-Medical):   Physical Activity:   . Days of Exercise per Week:   . Minutes of Exercise per Session:   Stress:   . Feeling of Stress :   Social Connections:   . Frequency of Communication with Friends and Family:   . Frequency of Social Gatherings with Friends and Family:   . Attends Religious Services:   . Active Member of Clubs or Organizations:   . Attends Banker Meetings:   Marland Kitchen Marital Status:      Family History: The patient's family history includes Diabetes in his unknown relative; Heart attack in his unknown relative; Hypertension in his unknown relative; Kidney disease in his father; Stroke in his unknown relative.  ROS:   Please see the history of present illness.     All other systems reviewed and are negative.  EKGs/Labs/Other Studies Reviewed:    The following studies were reviewed today:  Nuclear stress test excellent, he asked about blunting  blood pressure response during exercise.  07/19/18 - CT calcium score 1130, this was in all 3 vessels.  Crestor increased to 20 mg once a day.  EKG:  EKG is  ordered today.  The ekg ordered today demonstrates 06/24/2020 sinus rhythm 92 no other abnormalities 04/14/17 was personally reviewed today and demonstrated:  Normal sinus rhythm no other changes  Recent Labs: No results found for requested labs within last 8760 hours.  Recent Lipid Panel No results found for: CHOL, TRIG, HDL, CHOLHDL, VLDL, LDLCALC, LDLDIRECT  Physical Exam:    VS:  BP (!) 146/80   Pulse 92   Ht 5\' 6"  (1.676 m)   Wt 161 lb 6.4 oz (73.2 kg)   SpO2 98%   BMI 26.05 kg/m     Wt Readings from Last 3 Encounters:  06/24/20 161 lb 6.4 oz (73.2 kg)  04/07/19 148  lb (67.1 kg)  08/01/18 160 lb (72.6 kg)     GEN:  Well nourished, well developed in no acute distress HEENT: Normal NECK: No JVD; No carotid bruits LYMPHATICS: No lymphadenopathy CARDIAC: RRR, no murmurs, rubs, gallops RESPIRATORY:  Clear to auscultation without rales, wheezing or rhonchi  ABDOMEN: Soft, non-tender, non-distended MUSCULOSKELETAL:  No edema; No deformity  SKIN: Warm and dry NEUROLOGIC:  Alert and oriented x 3 PSYCHIATRIC:  Normal affect   ASSESSMENT:    1. Coronary artery calcification   2. Essential hypertension   3. Family history of arteriosclerotic cardiovascular disease   4. Mixed hyperlipidemia    PLAN:    In order of problems listed above:  Coronary artery calcification -Calcium score 1130. -Continue with Crestor 20 mg -Nuclear stress test did not show any ischemia  Hyperlipidemia -High intensity statin -LDL 93 last check triglycerides 130 HDL 71 -He is due for lipid panel with 10/01/18, PA.  I told him that if his LDL is still above 70, we should consider the addition of Zetia 10 mg to his Crestor 20 mg.  He is willing to try this.  Essential hypertension -Does have increased with stressful situations. Previously had edema with high-dose amlodipine Previously stopped spironolactone. Has been on a multidrug regimen with ACE inhibitor hydrochlorothiazide combination and Bystolic. Sometimes gets nervous when taking blood pressure.  Overall we will continue with current regimen.  Varicose veins -Mostly left leg.  Wears compression socks.   Medication Adjustments/Labs and Tests Ordered: Current medicines are reviewed at length with the patient today.  Concerns regarding medicines are outlined above.  Orders Placed This Encounter  Procedures  . EKG 12-Lead   No orders of the defined types were placed in this encounter.   Patient Instructions  Medication Instructions:  The current medical regimen is effective;  continue present  plan and medications.  *If you need a refill on your cardiac medications before your next appointment, please call your pharmacy*  Follow-Up: At [facility], you and your health needs are our priority.  As part of our continuing mission to provide you with exceptional heart care, we have created designated Provider Care Teams.  These Care Teams include your primary Cardiologist (physician) and Advanced Practice Providers (APPs -  Physician Assistants and Nurse Practitioners) who all work together to provide you with the care you need, when you need it.   Your next appointment:   12 month(s)  The format for your next appointment:   In Person  Provider:   [facility], MD  Thank you for choosing [facility]!!  Signed, Donato Schultz, MD  06/24/2020 5:04 PM    [facility]

## 2020-12-28 ENCOUNTER — Telehealth: Payer: Self-pay | Admitting: Cardiology

## 2020-12-28 NOTE — Telephone Encounter (Signed)
Noted - per Dr Anne Fu - Dani Gobble, PA will order Zetia 10 mg and f/u on lipids/ALT.

## 2020-12-28 NOTE — Telephone Encounter (Signed)
Spoke to Dani Gobble, PA his primary care provider.  His LDL was 80.  He has a high calcium score, coronary artery plaque calcified.  He is on Crestor 40 mg.  I suggested addition of Zetia 10 mg and repeat lipid panel.  If this is unsuccessful in getting him to goal of less than 70, Repatha would be a good solution for him.  Donato Schultz, MD

## 2021-12-21 ENCOUNTER — Ambulatory Visit (INDEPENDENT_AMBULATORY_CARE_PROVIDER_SITE_OTHER): Payer: BC Managed Care – PPO | Admitting: Cardiology

## 2021-12-21 ENCOUNTER — Other Ambulatory Visit: Payer: Self-pay

## 2021-12-21 ENCOUNTER — Encounter: Payer: Self-pay | Admitting: Cardiology

## 2021-12-21 VITALS — BP 150/80 | HR 81 | Ht 66.0 in | Wt 156.0 lb

## 2021-12-21 DIAGNOSIS — E782 Mixed hyperlipidemia: Secondary | ICD-10-CM | POA: Diagnosis not present

## 2021-12-21 DIAGNOSIS — I2584 Coronary atherosclerosis due to calcified coronary lesion: Secondary | ICD-10-CM | POA: Diagnosis not present

## 2021-12-21 DIAGNOSIS — I10 Essential (primary) hypertension: Secondary | ICD-10-CM | POA: Diagnosis not present

## 2021-12-21 DIAGNOSIS — I2510 Atherosclerotic heart disease of native coronary artery without angina pectoris: Secondary | ICD-10-CM | POA: Diagnosis not present

## 2021-12-21 NOTE — Assessment & Plan Note (Signed)
Multidrug regimen, amlodipine 5 mg lisinopril 20 mg nebivolol 5 mg.  Slightly elevated today but at last visit with Dani Gobble it was in the 130 systolic.  Continue to monitor.  Continue current medical management

## 2021-12-21 NOTE — Patient Instructions (Signed)
Medication Instructions:  Your physician recommends that you continue on your current medications as directed. Please refer to the Current Medication list given to you today.  *If you need a refill on your cardiac medications before your next appointment, please call your pharmacy*   Lab Work: none If you have labs (blood work) drawn today and your tests are completely normal, you will receive your results only by: MyChart Message (if you have MyChart) OR A paper copy in the mail If you have any lab test that is abnormal or we need to change your treatment, we will call you to review the results.   Testing/Procedures: none   Follow-Up: At [facility], you and your health needs are our priority.  As part of our continuing mission to provide you with exceptional heart care, we have created designated Provider Care Teams.  These Care Teams include your primary Cardiologist (physician) and Advanced Practice Providers (APPs -  Physician Assistants and Nurse Practitioners) who all work together to provide you with the care you need, when you need it.  We recommend signing up for the patient portal called "MyChart".  Sign up information is provided on this After Visit Summary.  MyChart is used to connect with patients for Virtual Visits (Telemedicine).  Patients are able to view lab/test results, encounter notes, upcoming appointments, etc.  Non-urgent messages can be sent to your provider as well.   To learn more about what you can do with MyChart, go to [email or web address].    Your next appointment:   1 year(s)  The format for your next appointment:   In Person  Provider:   Donato Schultz MD

## 2021-12-21 NOTE — Progress Notes (Signed)
Cardiology Office Note:    Date:  12/21/2021   ID:  Gary Castaneda, DOB 06/25/1971, MRN [id number]  PCP:  Gary Castaneda   Gary Castaneda:  None     Referring MD: Gary Castaneda    History of Present Illness:    Gary Castaneda is a 51 y.o. male here for the follow-up of difficult to control hypertension.  Had multivessel coronary artery calcification on calcium score.  Males in his family died at the age of 49 he states.  He has had no ischemia on nuclear stress test.  Continues with Crestor 20 mg.  He was just started on Repatha, PCSK9 inhibitor to help with his lipids.  His last LDL 86.  Overall he feels well, no chest pain, no shortness of breath.  He did have a very stressful several months, work has been quite hectic, Dietitianfinancial advisor/broker.  Past Medical History:  Diagnosis Date   HTN (hypertension)    Hyperlipidemia LDL goal < 100     No past surgical history on file.  Current Medications: Current Meds  Medication Sig   amLODipine (NORVASC) 5 MG tablet Take 5 mg by mouth daily.   aspirin EC 81 MG tablet Take 81 mg by mouth daily.   ezetimibe (ZETIA) 10 MG tablet Take 10 mg by mouth daily.   lisinopril (ZESTRIL) 20 MG tablet Take 20 mg by mouth daily.   nebivolol (BYSTOLIC) 5 MG tablet Take 1 tablet (5 mg total) by mouth daily. Must schedule follow up appt for further refills [phone] 1st attempt   rosuvastatin (CRESTOR) 20 MG tablet TAKE (1) TABLET DAILY AT BEDTIME.     Allergies:   Patient has no known allergies.   Social History   Socioeconomic History   Marital status: Single    Spouse name: Not on file   Number of children: Not on file   Years of education: Not on file   Highest education level: Not on file  Occupational History   Not on file  Tobacco Use   Smoking status: Never   Smokeless tobacco: Never  Substance and Sexual Activity   Alcohol use: No   Drug use: No   Sexual activity: Not on file   Other Topics Concern   Not on file  Social History Narrative   Not on file   Social Determinants of Health   Financial Resource Strain: Not on file  Food Insecurity: Not on file  Transportation Needs: Not on file  Physical Activity: Not on file  Stress: Not on file  Social Connections: Not on file     Family History: The patient's family history includes Diabetes in his unknown relative; Heart attack in his unknown relative; Hypertension in his unknown relative; Kidney disease in his father; Stroke in his unknown relative.  ROS:   Please see the history of present illness.    No fevers chills nausea vomiting syncope bleeding all other systems reviewed and are negative.  EKGs/Labs/Other Studies Reviewed:    The following studies were reviewed today: Coronary calcium score 20 19-1130 in all 3 vessels.  Crestor was increased at 20 mg  EKG:  EKG is  ordered today.  The ekg ordered today demonstrates sinus rhythm 81 no other changes  Recent Labs: No results found for requested labs within last 8760 hours.  Recent Lipid Panel No results found for: CHOL, TRIG, HDL, CHOLHDL, VLDL, LDLCALC, LDLDIRECT   Risk Assessment/Calculations:  Last LDL 86, ALT 45  Physical Exam:    VS:  BP (!) 150/80 (BP Location: Left Arm, Patient Position: Sitting, Cuff Size: Normal)    Pulse 81    Ht 5\' 6"  (1.676 m)    Wt 156 lb (70.8 kg)    SpO2 97%    BMI 25.18 kg/m     Wt Readings from Last 3 Encounters:  12/21/21 156 lb (70.8 kg)  06/24/20 161 lb 6.4 oz (73.2 kg)  04/07/19 148 lb (67.1 kg)     GEN:  Well nourished, well developed in no acute distress HEENT: Normal NECK: No JVD; No carotid bruits LYMPHATICS: No lymphadenopathy CARDIAC: RRR, no murmurs, no rubs, gallops RESPIRATORY:  Clear to auscultation without rales, wheezing or rhonchi  ABDOMEN: Soft, non-tender, non-distended MUSCULOSKELETAL:  No edema; No deformity  SKIN: Warm and dry NEUROLOGIC:  Alert and oriented x  3 PSYCHIATRIC:  Normal affect   ASSESSMENT:    1. Mixed hyperlipidemia   2. Essential hypertension   3. Coronary artery calcification    PLAN:    In order of problems listed above:  Essential hypertension Multidrug regimen, amlodipine 5 mg lisinopril 20 mg nebivolol 5 mg.  Slightly elevated today but at last visit with 06/07/19 it was in the 130 systolic.  Continue to monitor.  Continue current medical management  Mixed hyperlipidemia Currently on Crestor 20 mg and Zetia 10 mg.  LDL still remains above goal.  He was placed on Repatha, PCSK9 inhibitor approximately 1 month ago.  Excellent.  I think this should help him out significantly.  With his significant coronary calcium score, strong family history, I would advocate for an LDL of less than 55.  The lower the better.  Coronary artery calcification Score was 1130.  Aggressive lipid management.  PCSK9 inhibitor.  Crestor, Zetia.  Appreciate Dani Gobble.         Medication Adjustments/Labs and Tests Ordered: Current medicines are reviewed at length with the patient today.  Concerns regarding medicines are outlined above.  Orders Placed This Encounter  Procedures   EKG 12-Lead   No orders of the defined types were placed in this encounter.   Patient Instructions  Medication Instructions:  Your physician recommends that you continue on your current medications as directed. Please refer to the Current Medication list given to you today.  *If you need a refill on your cardiac medications before your next appointment, please call your pharmacy*   Lab Work: none If you have labs (blood work) drawn today and your tests are completely normal, you will receive your results only by: MyChart Message (if you have MyChart) OR A paper copy in the mail If you have any lab test that is abnormal or we need to change your treatment, we will call you to review the  results.   Testing/Procedures: none   Follow-Up: At [facility], you and your health needs are our priority.  As part of our continuing mission to provide you with exceptional heart care, we have created designated Provider Care Teams.  These Care Teams include your primary Castaneda (physician) and Advanced Practice Providers (APPs -  Physician Assistants and Nurse Practitioners) who all work together to provide you with the care you need, when you need it.  We recommend signing up for the patient portal called "MyChart".  Sign up information is provided on this After Visit Summary.  MyChart is used to connect with patients for Virtual Visits (Telemedicine).  Patients are  able to view lab/test results, encounter notes, upcoming appointments, etc.  Non-urgent messages can be sent to your provider as well.   To learn more about what you can do with MyChart, go to [email or web address].    Your next appointment:   1 year(s)  The format for your next appointment:   In Person  Provider:   Donato Schultz MD       Signed, Gary Schultz, MD  12/21/2021 4:50 PM    [facility]

## 2021-12-21 NOTE — Assessment & Plan Note (Signed)
Score was 1130.  Aggressive lipid management.  PCSK9 inhibitor.  Crestor, Zetia.  Appreciate Dani Gobble.

## 2021-12-21 NOTE — Assessment & Plan Note (Signed)
Currently on Crestor 20 mg and Zetia 10 mg.  LDL still remains above goal.  He was placed on Repatha, PCSK9 inhibitor approximately 1 month ago.  Excellent.  I think this should help him out significantly.  With his significant coronary calcium score, strong family history, I would advocate for an LDL of less than 55.  The lower the better.

## 2022-02-20 ENCOUNTER — Encounter: Payer: Self-pay | Admitting: Cardiology

## 2022-05-15 DIAGNOSIS — Z1211 Encounter for screening for malignant neoplasm of colon: Secondary | ICD-10-CM | POA: Diagnosis not present

## 2022-05-15 DIAGNOSIS — E785 Hyperlipidemia, unspecified: Secondary | ICD-10-CM | POA: Diagnosis not present

## 2022-05-15 DIAGNOSIS — I10 Essential (primary) hypertension: Secondary | ICD-10-CM | POA: Diagnosis not present

## 2022-06-19 DIAGNOSIS — I10 Essential (primary) hypertension: Secondary | ICD-10-CM | POA: Diagnosis not present

## 2022-06-19 DIAGNOSIS — E785 Hyperlipidemia, unspecified: Secondary | ICD-10-CM | POA: Diagnosis not present

## 2022-06-19 DIAGNOSIS — R600 Localized edema: Secondary | ICD-10-CM | POA: Diagnosis not present

## 2022-06-20 DIAGNOSIS — R600 Localized edema: Secondary | ICD-10-CM | POA: Diagnosis not present

## 2022-07-24 DIAGNOSIS — Z1329 Encounter for screening for other suspected endocrine disorder: Secondary | ICD-10-CM | POA: Diagnosis not present

## 2022-07-24 DIAGNOSIS — E785 Hyperlipidemia, unspecified: Secondary | ICD-10-CM | POA: Diagnosis not present

## 2022-07-24 DIAGNOSIS — I10 Essential (primary) hypertension: Secondary | ICD-10-CM | POA: Diagnosis not present

## 2022-07-24 DIAGNOSIS — R600 Localized edema: Secondary | ICD-10-CM | POA: Diagnosis not present

## 2022-08-28 DIAGNOSIS — E785 Hyperlipidemia, unspecified: Secondary | ICD-10-CM | POA: Diagnosis not present

## 2022-08-28 DIAGNOSIS — I10 Essential (primary) hypertension: Secondary | ICD-10-CM | POA: Diagnosis not present

## 2022-09-26 DIAGNOSIS — M79662 Pain in left lower leg: Secondary | ICD-10-CM | POA: Diagnosis not present

## 2022-10-02 DIAGNOSIS — T148XXA Other injury of unspecified body region, initial encounter: Secondary | ICD-10-CM | POA: Diagnosis not present

## 2022-10-02 DIAGNOSIS — I10 Essential (primary) hypertension: Secondary | ICD-10-CM | POA: Diagnosis not present

## 2022-10-02 DIAGNOSIS — E785 Hyperlipidemia, unspecified: Secondary | ICD-10-CM | POA: Diagnosis not present

## 2022-10-04 DIAGNOSIS — M79662 Pain in left lower leg: Secondary | ICD-10-CM | POA: Diagnosis not present

## 2023-01-30 DIAGNOSIS — R278 Other lack of coordination: Secondary | ICD-10-CM | POA: Diagnosis not present

## 2023-02-27 DIAGNOSIS — R278 Other lack of coordination: Secondary | ICD-10-CM | POA: Diagnosis not present

## 2023-03-13 DIAGNOSIS — R278 Other lack of coordination: Secondary | ICD-10-CM | POA: Diagnosis not present

## 2023-03-16 DIAGNOSIS — Z131 Encounter for screening for diabetes mellitus: Secondary | ICD-10-CM | POA: Diagnosis not present

## 2023-03-16 DIAGNOSIS — E785 Hyperlipidemia, unspecified: Secondary | ICD-10-CM | POA: Diagnosis not present

## 2023-03-16 DIAGNOSIS — Z125 Encounter for screening for malignant neoplasm of prostate: Secondary | ICD-10-CM | POA: Diagnosis not present

## 2023-03-16 DIAGNOSIS — Z13220 Encounter for screening for lipoid disorders: Secondary | ICD-10-CM | POA: Diagnosis not present

## 2023-03-16 DIAGNOSIS — Z1329 Encounter for screening for other suspected endocrine disorder: Secondary | ICD-10-CM | POA: Diagnosis not present

## 2023-03-26 DIAGNOSIS — L408 Other psoriasis: Secondary | ICD-10-CM | POA: Diagnosis not present

## 2023-03-27 DIAGNOSIS — R278 Other lack of coordination: Secondary | ICD-10-CM | POA: Diagnosis not present

## 2023-04-10 DIAGNOSIS — R278 Other lack of coordination: Secondary | ICD-10-CM | POA: Diagnosis not present

## 2023-05-22 DIAGNOSIS — R278 Other lack of coordination: Secondary | ICD-10-CM | POA: Diagnosis not present

## 2023-05-28 ENCOUNTER — Other Ambulatory Visit (HOSPITAL_BASED_OUTPATIENT_CLINIC_OR_DEPARTMENT_OTHER): Payer: Self-pay

## 2023-05-28 MED ORDER — REPATHA PUSHTRONEX SYSTEM 420 MG/3.5ML ~~LOC~~ SOCT
420.0000 mg | SUBCUTANEOUS | 3 refills | Status: DC
  Filled 2023-05-28: qty 3.5, 30d supply, fill #0

## 2023-05-29 ENCOUNTER — Other Ambulatory Visit (HOSPITAL_BASED_OUTPATIENT_CLINIC_OR_DEPARTMENT_OTHER): Payer: Self-pay

## 2023-06-03 ENCOUNTER — Other Ambulatory Visit (HOSPITAL_BASED_OUTPATIENT_CLINIC_OR_DEPARTMENT_OTHER): Payer: Self-pay

## 2023-06-04 ENCOUNTER — Other Ambulatory Visit (HOSPITAL_BASED_OUTPATIENT_CLINIC_OR_DEPARTMENT_OTHER): Payer: Self-pay

## 2023-06-08 ENCOUNTER — Other Ambulatory Visit (HOSPITAL_BASED_OUTPATIENT_CLINIC_OR_DEPARTMENT_OTHER): Payer: Self-pay

## 2023-08-08 DIAGNOSIS — R278 Other lack of coordination: Secondary | ICD-10-CM | POA: Diagnosis not present

## 2023-08-22 DIAGNOSIS — R278 Other lack of coordination: Secondary | ICD-10-CM | POA: Diagnosis not present

## 2023-09-05 DIAGNOSIS — R278 Other lack of coordination: Secondary | ICD-10-CM | POA: Diagnosis not present

## 2023-10-17 DIAGNOSIS — E785 Hyperlipidemia, unspecified: Secondary | ICD-10-CM | POA: Diagnosis not present

## 2023-10-17 DIAGNOSIS — I10 Essential (primary) hypertension: Secondary | ICD-10-CM | POA: Diagnosis not present

## 2023-10-17 DIAGNOSIS — Z125 Encounter for screening for malignant neoplasm of prostate: Secondary | ICD-10-CM | POA: Diagnosis not present

## 2023-10-17 DIAGNOSIS — Z1330 Encounter for screening examination for mental health and behavioral disorders, unspecified: Secondary | ICD-10-CM | POA: Diagnosis not present

## 2023-10-17 DIAGNOSIS — Z130 Encounter for screening for diseases of the blood and blood-forming organs and certain disorders involving the immune mechanism: Secondary | ICD-10-CM | POA: Diagnosis not present

## 2023-10-17 DIAGNOSIS — Z1329 Encounter for screening for other suspected endocrine disorder: Secondary | ICD-10-CM | POA: Diagnosis not present

## 2023-10-17 DIAGNOSIS — Z131 Encounter for screening for diabetes mellitus: Secondary | ICD-10-CM | POA: Diagnosis not present

## 2023-10-17 DIAGNOSIS — R278 Other lack of coordination: Secondary | ICD-10-CM | POA: Diagnosis not present

## 2023-10-17 DIAGNOSIS — Z1321 Encounter for screening for nutritional disorder: Secondary | ICD-10-CM | POA: Diagnosis not present

## 2023-12-14 ENCOUNTER — Encounter: Payer: Self-pay | Admitting: Cardiology

## 2023-12-14 ENCOUNTER — Ambulatory Visit: Payer: BC Managed Care – PPO | Attending: Cardiology | Admitting: Cardiology

## 2023-12-14 VITALS — BP 162/80 | HR 80 | Ht 66.0 in | Wt 158.2 lb

## 2023-12-14 DIAGNOSIS — I2510 Atherosclerotic heart disease of native coronary artery without angina pectoris: Secondary | ICD-10-CM | POA: Diagnosis not present

## 2023-12-14 DIAGNOSIS — Z09 Encounter for follow-up examination after completed treatment for conditions other than malignant neoplasm: Secondary | ICD-10-CM

## 2023-12-14 DIAGNOSIS — I2584 Coronary atherosclerosis due to calcified coronary lesion: Secondary | ICD-10-CM

## 2023-12-14 DIAGNOSIS — I10 Essential (primary) hypertension: Secondary | ICD-10-CM

## 2023-12-14 DIAGNOSIS — E782 Mixed hyperlipidemia: Secondary | ICD-10-CM

## 2023-12-14 MED ORDER — METOPROLOL TARTRATE 100 MG PO TABS: 100.0000 mg | ORAL_TABLET | Freq: Once | ORAL | 0 refills | Status: DC

## 2023-12-14 NOTE — Progress Notes (Signed)
Cardiology Office Note:  .   Date:  12/14/2023  ID:  Gary Castaneda, DOB Nov 30, 1970, MRN [id number] PCP: Dani Gobble, PA-C  [facility] Providers Cardiologist:  Donato Schultz, MD     History of Present Illness: .   Gary Castaneda is a 53 y.o. male Discussed with the use of AI scribe   History of Present Illness   The patient, a 53 year old with a significant family history of early cardiac deaths (age 60 in males), presents for a follow-up visit. He has a history of difficult-to-control hypertension and multivessel coronary artery calcification, as evidenced by a coronary calcium score of 1130 in all three vessels from a 2019 test. Despite this, he has not experienced any ischemic symptoms, as confirmed by a nuclear stress test.  The patient is currently on a regimen of Zetia 10mg , amlodipine 10mg , aspirin 81mg , Crestor 20mg , Bystolic 5mg , lisinopril 20mg , and Repatha 420mg  every 30 days. He reports that his blood pressure tends to run high during doctor's visits, with a recent reading of 162/80, but is generally lower at home or in other settings.  The patient denies any chest pain or shortness of breath and reports feeling physically well despite a busy lifestyle. He has been managing his exhaustion, attributing it to his personal life and work commitments.           Studies Reviewed: Marland Kitchen   EKG Interpretation Date/Time:  Friday December 14 2023 16:18:04 EST Ventricular Rate:  80 PR Interval:  154 QRS Duration:  86 QT Interval:  350 QTC Calculation: 403 R Axis:   65  Text Interpretation: Normal sinus rhythm Normal ECG No previous ECGs available Confirmed by Donato Schultz ([id number]) on 12/14/2023 4:36:22 PM    Results   LABS LDL: 40 (10/17/2023) Triglycerides: 105 (10/17/2023) HDL: 58 (10/17/2023) Hemoglobin: 14.3 (10/17/2023) Creatinine: 0.85 (10/17/2023)  RADIOLOGY Coronary calcium score: 1130 in all three vessels (2019)  DIAGNOSTIC Nuclear stress test: No  ischemia     Risk Assessment/Calculations:           Physical Exam:   VS:  BP (!) 162/80   Pulse 80   Ht 5\' 6"  (1.676 m)   Wt 158 lb 3.2 oz (71.8 kg)   SpO2 99%   BMI 25.53 kg/m    Wt Readings from Last 3 Encounters:  12/14/23 158 lb 3.2 oz (71.8 kg)  12/21/21 156 lb (70.8 kg)  06/24/20 161 lb 6.4 oz (73.2 kg)    GEN: Well nourished, well developed in no acute distress NECK: No JVD; No carotid bruits CARDIAC: RRR, no murmurs, no rubs, no gallops RESPIRATORY:  Clear to auscultation without rales, wheezing or rhonchi  ABDOMEN: Soft, non-tender, non-distended EXTREMITIES:  No edema; No deformity   ASSESSMENT AND PLAN: .    Assessment and Plan    Coronary Artery Disease with Multivessel Coronary Artery Calcification Multivessel coronary artery calcification with a coronary calcium score of 1130 in 2019. No ischemia on nuclear stress test. Family history of males dying at age 37. Discussed coronary CT scan to assess for significant stenosis. Explained that contrast is used for visualization, though calcium deposits may hinder analysis. Patient prefers to schedule the test in 3-6 months due to current commitments. - Order coronary CT scan in 3-6 months - Coordinate with scheduling team - Continue Zetia 10 mg daily, Repatha 420 mg every 30 days, aspirin 81 mg daily, Crestor 20 mg daily  Hypertension Difficult to control hypertension. Blood pressure today was 162/80, previously  145/84. Currently on amlodipine, Bystolic, and lisinopril. No changes in medications at this time. - Continue amlodipine 10 mg daily, Bystolic 5 mg daily, lisinopril 20 mg daily - Monitor blood pressure at home and report significant changes  General Health Maintenance Reviewed recent lab results: LDL 40, triglycerides 105, HDL 58, hemoglobin 14.3, creatinine 0.85. - Continue current medications and lifestyle modifications - Follow up in one year for routine check-up.               Signed, Donato Schultz, MD

## 2023-12-14 NOTE — Patient Instructions (Signed)
Medication Instructions:  The current medical regimen is effective;  continue present plan and medications.  *If you need a refill on your cardiac medications before your next appointment, please call your pharmacy*  Testing/Procedures:   Your cardiac CT will be scheduled at:   [facility] [address] [place], [place] [address] [phone]  Please arrive at the [facility] and [facility] Entrance (Entrance C2) of [facility] 30 minutes prior to test start time. You can use the FREE valet parking offered at entrance C (encouraged to control the heart rate for the test)  Proceed to the [facility] Radiology Department (first floor) to check-in and test prep.  All radiology patients and guests should use entrance C2 at [facility], accessed from [facility], even though the hospital's physical address listed is [address].    Please follow these instructions carefully (unless otherwise directed):  An IV will be required for this test and Nitroglycerin will be given.  Hold all erectile dysfunction medications at least 3 days (72 hrs) prior to test. (Ie viagra, cialis, sildenafil, tadalafil, etc)   On the Night Before the Test: Be sure to Drink plenty of water. Do not consume any caffeinated/decaffeinated beverages or chocolate 12 hours prior to your test. Do not take any antihistamines 12 hours prior to your test.  On the Day of the Test: Drink plenty of water until 1 hour prior to the test. Do not eat any food 1 hour prior to test. You may take your regular medications prior to the test.  Take metoprolol (Lopressor) two hours prior to test. If you take Furosemide/Hydrochlorothiazide/Spironolactone/Chlorthalidone, please HOLD on the morning of the test. Patients who wear a continuous glucose monitor MUST remove the device prior to scanning.      After the Test: Drink plenty of water. After receiving IV contrast, you may  experience a mild flushed feeling. This is normal. On occasion, you may experience a mild rash up to 24 hours after the test. This is not dangerous. If this occurs, you can take Benadryl 25 mg and increase your fluid intake. If you experience trouble breathing, this can be serious. If it is severe call 911 IMMEDIATELY. If it is mild, please call our office.  We will call to schedule your test 2-4 weeks out understanding that some insurance companies will need an authorization prior to the service being performed.   For more information and frequently asked questions, please visit our website : [email or web address]  For non-scheduling related questions, please contact the cardiac imaging nurse navigator should you have any questions/concerns: Cardiac Imaging Nurse Navigators Direct Office Dial: [phone]   For scheduling needs, including cancellations and rescheduling, please call [place], [phone].   Follow-Up: At [facility], you and your health needs are our priority.  As part of our continuing mission to provide you with exceptional heart care, we have created designated Provider Care Teams.  These Care Teams include your primary Cardiologist (physician) and Advanced Practice Providers (APPs -  Physician Assistants and Nurse Practitioners) who all work together to provide you with the care you need, when you need it.  We recommend signing up for the patient portal called "MyChart".  Sign up information is provided on this After Visit Summary.  MyChart is used to connect with patients for Virtual Visits (Telemedicine).  Patients are able to view lab/test results, encounter notes, upcoming appointments, etc.  Non-urgent messages can be sent to your provider as well.  To learn more about what you can do with MyChart, go to [email or web address].    Your next appointment:   1 year(s)  Provider:   Donato Schultz, MD

## 2023-12-19 NOTE — Addendum Note (Signed)
Addended by: Sharin Grave on: 12/19/2023 04:07 PM   Modules accepted: Orders

## 2024-03-27 ENCOUNTER — Encounter (HOSPITAL_COMMUNITY): Payer: Self-pay

## 2024-03-27 ENCOUNTER — Telehealth (HOSPITAL_COMMUNITY): Payer: Self-pay | Admitting: *Deleted

## 2024-03-27 NOTE — Telephone Encounter (Signed)
 me

## 2024-03-28 ENCOUNTER — Ambulatory Visit (HOSPITAL_COMMUNITY)
Admission: RE | Admit: 2024-03-28 | Discharge: 2024-03-28 | Disposition: A | Discharge status: Home / Self Care | Source: Ambulatory Visit | Attending: Cardiology | Admitting: Cardiology

## 2024-03-28 DIAGNOSIS — I2510 Atherosclerotic heart disease of native coronary artery without angina pectoris: Secondary | ICD-10-CM | POA: Diagnosis not present

## 2024-03-28 DIAGNOSIS — I2584 Coronary atherosclerosis due to calcified coronary lesion: Secondary | ICD-10-CM | POA: Insufficient documentation

## 2024-03-28 DIAGNOSIS — I517 Cardiomegaly: Secondary | ICD-10-CM | POA: Diagnosis not present

## 2024-03-28 MED ORDER — NITROGLYCERIN 0.4 MG SL SUBL
SUBLINGUAL_TABLET | SUBLINGUAL | Status: AC
  Filled 2024-03-28: qty 2

## 2024-03-28 MED ORDER — IOHEXOL 350 MG/ML SOLN
100.0000 mL | Freq: Once | INTRAVENOUS | Status: AC | PRN
  Administered 2024-03-28: 100 mL via INTRAVENOUS

## 2024-03-31 ENCOUNTER — Encounter: Payer: Self-pay | Admitting: Cardiology

## 2024-03-31 ENCOUNTER — Other Ambulatory Visit: Payer: Self-pay | Admitting: Cardiology

## 2024-03-31 ENCOUNTER — Ambulatory Visit (HOSPITAL_COMMUNITY)
Admission: RE | Admit: 2024-03-31 | Discharge: 2024-03-31 | Disposition: A | Discharge status: Home / Self Care | Source: Ambulatory Visit | Attending: Cardiology | Admitting: Cardiology

## 2024-03-31 DIAGNOSIS — R931 Abnormal findings on diagnostic imaging of heart and coronary circulation: Secondary | ICD-10-CM

## 2024-03-31 DIAGNOSIS — I2510 Atherosclerotic heart disease of native coronary artery without angina pectoris: Secondary | ICD-10-CM

## 2024-04-02 NOTE — Progress Notes (Signed)
 Cardiology Office Note    Patient Name: Gary Castaneda Date of Encounter: 04/03/2024  Primary Care Provider:  Jearlean Mince, PA-C Primary Cardiologist:  Dorothye Gathers, MD Primary Electrophysiologist: None   Past Medical History    Past Medical History:  Diagnosis Date   HTN (hypertension)    Hyperlipidemia LDL goal < 100     History of Present Illness  Gary Castaneda is a 53 y.o. male with a PMH of coronary calcifications, family history of early CAD, HTN, HLD who presents today for pre-LHC workup.  Gary Castaneda has been followed by Dr. Renna Cary since 2015 when he was seen for difficult to control hypertension.  He completed a coronary calcium  score on 06/2018 that showed calcium  score of 1130 with three-vessel calcification noted.  He underwent a follow-up exercise Myoview  that was reassuring with no perfusion defects or evidence of infarction.  He was started on ASA 81 mg and continued on current blood pressure medication.  He was last seen by Dr. Renna Cary on 12/14/2023 and denied any chest pain or exertional symptoms.  He underwent a coronary CTA on  03/31/2024 due to family history of premature CAD.  Coronary CT results revealed moderate stenosis in proximal LAD with mild stenosis in mid to distal LAD and calcium  score of 2855 with studies sent for FFR with analysis of FFR showing  decreased flow down the LAD as well as RCA.  Recommendation was made to pursue further ischemic evaluation with outpatient left heart catheterization.  Gary Castaneda today presents today for presents for evaluation prior to heart catheterization. He was referred by Dr. Renna Cary for further evaluation of coronary artery disease. He has a history of hypertension and coronary artery disease, initially evaluated in 2015 for elevated blood pressure. A calcium  score test at that time revealed calcification in all three coronary arteries. An exercise stress test was normal, and he was started on aspirin. Currently, he is  on Repatha  and Crestor . A recent CTA indicated an increase in his calcium  score from 1130 to 2855, with significant blockage and decreased blood flow in the LAD and right coronary artery. No chest pain reported. He has a family history of coronary disease; his father had a heart attack and quintuple bypass surgery in his fifties. He also has a history of varicose veins in his left leg, for which he wears compression socks to manage swelling. His current medications include Repatha  injections every other week and aspirin. He has a history of elevated blood pressure readings, with initial blood pressure of 150/80 and a recent measurement of 148/84 mmHg in the office. Patient denies chest pain, palpitations, dyspnea, PND, orthopnea, nausea, vomiting, dizziness, syncope, edema, weight gain, or early satiety.  Discussed the use of AI scribe software for clinical note transcription with the patient, who gave verbal consent to proceed.  History of Present Illness   Review of Systems  Please see the history of present illness.    All other systems reviewed and are otherwise negative except as noted above.  Physical Exam    Wt Readings from Last 3 Encounters:  04/03/24 152 lb (68.9 kg)  12/14/23 158 lb 3.2 oz (71.8 kg)  12/21/21 156 lb (70.8 kg)   VS: Vitals:   04/03/24 1405 04/03/24 1447  BP: (!) 150/80 (!) 148/84  Pulse: 74   SpO2: 98%   ,Body mass index is 24.53 kg/m. GEN: Well nourished, well developed in no acute distress Neck: No JVD; No carotid bruits Pulmonary: Clear to  auscultation without rales, wheezing or rhonchi  Cardiovascular: Normal rate. Regular rhythm. Normal S1. Normal S2.   Murmurs: There is no murmur.  ABDOMEN: Soft, non-tender, non-distended EXTREMITIES:  No edema; No deformity   EKG/LABS/ Recent Cardiac Studies   ECG personally reviewed by me today -sinus rhythm with rate of 74 bpm and no acute changes consistent with previous EKG.  Risk Assessment/Calculations:           No results found for: "WBC", "HGB", "HCT", "MCV", "PLT" No results found for: "CREATININE", "BUN", "NA", "K", "CL", "CO2" No results found for: "CHOL", "HDL", "LDLCALC", "LDLDIRECT", "TRIG", "CHOLHDL"  No results found for: "HGBA1C" Assessment & Plan   Assessment & Plan   1.  Coronary artery disease: - FH of premature CAD with abnormal coronary CTA revealing stenosis of LAD and RCA - Today patient reports no chest pain or angina since previous follow-up. -Heart catheterization recommended for further evaluation  -informed consent obtained with discussion of risks and benefits. -CBC and BMET today - Encourage hydration post-procedure. - Continue aspirin therapy.  2.  Essential hypertension: - HYPERTENSION CONTROL Vitals:   04/03/24 1405 04/03/24 1447  BP: (!) 150/80 (!) 148/84    The patient's blood pressure is elevated above target today.  In order to address the patient's elevated BP: Blood pressure will be monitored at home to determine if medication changes need to be made.; The blood pressure is usually elevated in clinic.  Blood pressures monitored at home have been optimal.     - Continue Bystolic  5 mg, lisinopril 20 mg and Norvasc 10 mg  3.  Hyperlipidemia: - Patient's last LDL cholesterol was 40 and total cholesterol of 117 with triglycerides of 105 - Continue current treatment with Repatha , ezetimibe 10 mg and Crestor  20 mg  4.  Family history of premature CAD: - Patient reports premature CAD with father having bypass in his 55s - Patient with elevated calcium  score and LHC arranged for further evaluation.  Disposition: Follow-up with Dorothye Gathers, MD or APP following left heart catheterization Informed Consent   Shared Decision Making/Informed Consent The risks [stroke (1 in 1000), death (1 in 1000), kidney failure [usually temporary] (1 in 500), bleeding (1 in 200), allergic reaction [possibly serious] (1 in 200)], benefits (diagnostic support and  management of coronary artery disease) and alternatives of a cardiac catheterization were discussed in detail with Mr. Gregori and he is willing to proceed.      Signed, Francene Ing, Retha Cast, NP 04/03/2024, 2:50 PM [facility]

## 2024-04-02 NOTE — H&P (View-Only) (Signed)
 Cardiology Office Note    Patient Name: Gary Castaneda Date of Encounter: 04/03/2024  Primary Care Provider:  Jearlean Mince, PA-C Primary Cardiologist:  Dorothye Gathers, MD Primary Electrophysiologist: None   Past Medical History    Past Medical History:  Diagnosis Date   HTN (hypertension)    Hyperlipidemia LDL goal < 100     History of Present Illness  Gary Castaneda is a 53 y.o. male with a PMH of coronary calcifications, family history of early CAD, HTN, HLD who presents today for pre-LHC workup.  Gary Castaneda has been followed by Dr. Renna Cary since 2015 when he was seen for difficult to control hypertension.  He completed a coronary calcium  score on 06/2018 that showed calcium  score of 1130 with three-vessel calcification noted.  He underwent a follow-up exercise Myoview  that was reassuring with no perfusion defects or evidence of infarction.  He was started on ASA 81 mg and continued on current blood pressure medication.  He was last seen by Dr. Renna Cary on 12/14/2023 and denied any chest pain or exertional symptoms.  He underwent a coronary CTA on  03/31/2024 due to family history of premature CAD.  Coronary CT results revealed moderate stenosis in proximal LAD with mild stenosis in mid to distal LAD and calcium  score of 2855 with studies sent for FFR with analysis of FFR showing  decreased flow down the LAD as well as RCA.  Recommendation was made to pursue further ischemic evaluation with outpatient left heart catheterization.  Gary Castaneda today presents today for presents for evaluation prior to heart catheterization. He was referred by Dr. Renna Cary for further evaluation of coronary artery disease. He has a history of hypertension and coronary artery disease, initially evaluated in 2015 for elevated blood pressure. A calcium  score test at that time revealed calcification in all three coronary arteries. An exercise stress test was normal, and he was started on aspirin. Currently, he is  on Repatha  and Crestor . A recent CTA indicated an increase in his calcium  score from 1130 to 2855, with significant blockage and decreased blood flow in the LAD and right coronary artery. No chest pain reported. He has a family history of coronary disease; his father had a heart attack and quintuple bypass surgery in his fifties. He also has a history of varicose veins in his left leg, for which he wears compression socks to manage swelling. His current medications include Repatha  injections every other week and aspirin. He has a history of elevated blood pressure readings, with initial blood pressure of 150/80 and a recent measurement of 148/84 mmHg in the office. Patient denies chest pain, palpitations, dyspnea, PND, orthopnea, nausea, vomiting, dizziness, syncope, edema, weight gain, or early satiety.  Discussed the use of AI scribe software for clinical note transcription with the patient, who gave verbal consent to proceed.  History of Present Illness   Review of Systems  Please see the history of present illness.    All other systems reviewed and are otherwise negative except as noted above.  Physical Exam    Wt Readings from Last 3 Encounters:  04/03/24 152 lb (68.9 kg)  12/14/23 158 lb 3.2 oz (71.8 kg)  12/21/21 156 lb (70.8 kg)   VS: Vitals:   04/03/24 1405 04/03/24 1447  BP: (!) 150/80 (!) 148/84  Pulse: 74   SpO2: 98%   ,Body mass index is 24.53 kg/m. GEN: Well nourished, well developed in no acute distress Neck: No JVD; No carotid bruits Pulmonary: Clear to  auscultation without rales, wheezing or rhonchi  Cardiovascular: Normal rate. Regular rhythm. Normal S1. Normal S2.   Murmurs: There is no murmur.  ABDOMEN: Soft, non-tender, non-distended EXTREMITIES:  No edema; No deformity   EKG/LABS/ Recent Cardiac Studies   ECG personally reviewed by me today -sinus rhythm with rate of 74 bpm and no acute changes consistent with previous EKG.  Risk Assessment/Calculations:           No results found for: "WBC", "HGB", "HCT", "MCV", "PLT" No results found for: "CREATININE", "BUN", "NA", "K", "CL", "CO2" No results found for: "CHOL", "HDL", "LDLCALC", "LDLDIRECT", "TRIG", "CHOLHDL"  No results found for: "HGBA1C" Assessment & Plan   Assessment & Plan   1.  Coronary artery disease: - FH of premature CAD with abnormal coronary CTA revealing stenosis of LAD and RCA - Today patient reports no chest pain or angina since previous follow-up. -Heart catheterization recommended for further evaluation  -informed consent obtained with discussion of risks and benefits. -CBC and BMET today - Encourage hydration post-procedure. - Continue aspirin therapy.  2.  Essential hypertension: - HYPERTENSION CONTROL Vitals:   04/03/24 1405 04/03/24 1447  BP: (!) 150/80 (!) 148/84    The patient's blood pressure is elevated above target today.  In order to address the patient's elevated BP: Blood pressure will be monitored at home to determine if medication changes need to be made.; The blood pressure is usually elevated in clinic.  Blood pressures monitored at home have been optimal.     - Continue Bystolic  5 mg, lisinopril 20 mg and Norvasc 10 mg  3.  Hyperlipidemia: - Patient's last LDL cholesterol was 40 and total cholesterol of 117 with triglycerides of 105 - Continue current treatment with Repatha , ezetimibe 10 mg and Crestor  20 mg  4.  Family history of premature CAD: - Patient reports premature CAD with father having bypass in his 55s - Patient with elevated calcium  score and LHC arranged for further evaluation.  Disposition: Follow-up with Dorothye Gathers, MD or APP following left heart catheterization Informed Consent   Shared Decision Making/Informed Consent The risks [stroke (1 in 1000), death (1 in 1000), kidney failure [usually temporary] (1 in 500), bleeding (1 in 200), allergic reaction [possibly serious] (1 in 200)], benefits (diagnostic support and  management of coronary artery disease) and alternatives of a cardiac catheterization were discussed in detail with Gary Castaneda and he is willing to proceed.      Signed, Francene Ing, Retha Cast, NP 04/03/2024, 2:50 PM [facility]

## 2024-04-03 ENCOUNTER — Ambulatory Visit: Attending: Nurse Practitioner | Admitting: Nurse Practitioner

## 2024-04-03 ENCOUNTER — Encounter: Payer: Self-pay | Admitting: Nurse Practitioner

## 2024-04-03 VITALS — BP 148/84 | HR 74 | Ht 66.0 in | Wt 152.0 lb

## 2024-04-03 DIAGNOSIS — E782 Mixed hyperlipidemia: Secondary | ICD-10-CM

## 2024-04-03 DIAGNOSIS — I2510 Atherosclerotic heart disease of native coronary artery without angina pectoris: Secondary | ICD-10-CM

## 2024-04-03 DIAGNOSIS — I10 Essential (primary) hypertension: Secondary | ICD-10-CM | POA: Diagnosis not present

## 2024-04-03 DIAGNOSIS — I2584 Coronary atherosclerosis due to calcified coronary lesion: Secondary | ICD-10-CM

## 2024-04-03 DIAGNOSIS — Z01812 Encounter for preprocedural laboratory examination: Secondary | ICD-10-CM

## 2024-04-03 NOTE — Patient Instructions (Signed)
 Medication Instructions:  Your physician recommends that you continue on your current medications as directed. Please refer to the Current Medication list given to you today.  *If you need a refill on your cardiac medications before your next appointment, please call your pharmacy*  Lab Work: TODAY (go to the 1st floor lab): CBC, BMET If you have labs (blood work) drawn today and your tests are completely normal, you will receive your results only by: MyChart Message (if you have MyChart) OR A paper copy in the mail If you have any lab test that is abnormal or we need to change your treatment, we will call you to review the results.  Testing/Procedures: Your physician has requested that you have a cardiac catheterization. Cardiac catheterization is used to diagnose and/or treat various heart conditions. Doctors may recommend this procedure for a number of different reasons. The most common reason is to evaluate chest pain. Chest pain can be a symptom of coronary artery disease (CAD), and cardiac catheterization can show whether plaque is narrowing or blocking your heart's arteries. This procedure is also used to evaluate the valves, as well as measure the blood flow and oxygen levels in different parts of your heart. For further information please visit [email or web address] Please follow instruction sheet, as given.  Follow-Up: At [facility], you and your health needs are our priority.  As part of our continuing mission to provide you with exceptional heart care, our providers are all part of one team.  This team includes your primary Cardiologist (physician) and Advanced Practice Providers or APPs (Physician Assistants and Nurse Practitioners) who all work together to provide you with the care you need, when you need it.  Your next appointment:   6 month(s)  The format for your next appointment:   In Person  Provider:   Dorothye Gathers, MD or Charles Connor, NP   We recommend signing  up for the patient portal called "MyChart".  Sign up information is provided on this After Visit Summary.  MyChart is used to connect with patients for Virtual Visits (Telemedicine).  Patients are able to view lab/test results, encounter notes, upcoming appointments, etc.  Non-urgent messages can be sent to your provider as well.   To learn more about what you can do with MyChart, go to [email or web address].   Other Instructions Cath Instructions  [facility] A DEPT OF [place] AT [address] A DEPT OF THE [place] [address] [place] [address] Dept: [phone] Loc: [phone]  JKOBE CONNIFF  04/03/2024  You are scheduled for a Cardiac Catheterization on Tuesday, May 13 with Dr. Randene Bustard.  1. Please arrive at the [facility] (Main Entrance A) at [facility]: [address] [place], [place] [address] at 10:00 AM (This time is 2 hour(s) before your procedure to ensure your preparation).   Free valet parking service is available. You will check in at ADMITTING. The support person will be asked to wait in the waiting room.  It is OK to have someone drop you off and come back when you are ready to be discharged.    Special note: Every effort is made to have your procedure done on time. Please understand that emergencies sometimes delay scheduled procedures.  2. Diet: Do not eat solid foods after midnight.  The patient may have clear liquids until 5am upon the day of the procedure.  3. Labs: CBC, BMP today  4. Medication  instructions in preparation for your procedure:   Contrast Allergy: No  On the morning of your procedure, take your Aspirin 81 mg and any morning medicines NOT listed above.  You may use sips of water.  5. Plan to go home the same day, you will only stay overnight if medically necessary. 6. Bring a current list of your medications and current insurance cards. 7. You MUST have a responsible  person to drive you home. 8. Someone MUST be with you the first 24 hours after you arrive home or your discharge will be delayed. 9. Please wear clothes that are easy to get on and off and wear slip-on shoes.  Thank you for allowing us  to care for you!   -- [facility]

## 2024-04-05 LAB — CBC
Hematocrit: 37.8 % (ref 37.5–51.0)
Hemoglobin: 13.3 g/dL (ref 13.0–17.7)
MCH: 34.3 pg — ABNORMAL HIGH (ref 26.6–33.0)
MCHC: 35.2 g/dL (ref 31.5–35.7)
MCV: 97 fL (ref 79–97)
Platelets: 207 10*3/uL (ref 150–450)
RBC: 3.88 x10E6/uL — ABNORMAL LOW (ref 4.14–5.80)
RDW: 12.1 % (ref 11.6–15.4)
WBC: 5.5 10*3/uL (ref 3.4–10.8)

## 2024-04-05 LAB — BASIC METABOLIC PANEL WITH GFR
BUN/Creatinine Ratio: 12 (ref 9–20)
BUN: 13 mg/dL (ref 6–24)
CO2: 22 mmol/L (ref 20–29)
Calcium: 9.9 mg/dL (ref 8.7–10.2)
Chloride: 97 mmol/L (ref 96–106)
Creatinine, Ser: 1.07 mg/dL (ref 0.76–1.27)
Glucose: 84 mg/dL (ref 70–99)
Potassium: 4.3 mmol/L (ref 3.5–5.2)
Sodium: 139 mmol/L (ref 134–144)
eGFR: 85 mL/min/{1.73_m2} (ref >59)

## 2024-04-07 ENCOUNTER — Telehealth: Payer: Self-pay | Admitting: *Deleted

## 2024-04-07 NOTE — Telephone Encounter (Addendum)
 Cardiac Catheterization scheduled at [facility] for: Tuesday Apr 08, 2024 12 Noon Arrival time [facility] Main Entrance A at: 10 AM  Nothing to eat after midnight prior to procedure, clear liquids until 5 AM day of procedure  Medication instructions: -Usual morning medications can be taken with sips of water including aspirin 81 mg.  Plan to go home the same day, you will only stay overnight if medically necessary.  You must have responsible adult to drive you home.  Someone must be with you the first 24 hours after you arrive home.  Reviewed procedure instructions with patient.

## 2024-04-08 ENCOUNTER — Other Ambulatory Visit: Payer: Self-pay

## 2024-04-08 ENCOUNTER — Ambulatory Visit (HOSPITAL_COMMUNITY)
Admission: RE | Admit: 2024-04-08 | Discharge: 2024-04-08 | Disposition: A | Discharge status: Home / Self Care | Attending: Cardiology | Admitting: Cardiology

## 2024-04-08 ENCOUNTER — Encounter (HOSPITAL_COMMUNITY): Payer: Self-pay | Admitting: Cardiology

## 2024-04-08 ENCOUNTER — Ambulatory Visit: Payer: Self-pay

## 2024-04-08 ENCOUNTER — Encounter (HOSPITAL_COMMUNITY)
Admission: RE | Disposition: A | Discharge status: Home / Self Care | Payer: Self-pay | Source: Home / Self Care | Attending: Cardiology

## 2024-04-08 ENCOUNTER — Other Ambulatory Visit: Payer: Self-pay | Admitting: Cardiology

## 2024-04-08 DIAGNOSIS — R931 Abnormal findings on diagnostic imaging of heart and coronary circulation: Secondary | ICD-10-CM | POA: Diagnosis not present

## 2024-04-08 DIAGNOSIS — E785 Hyperlipidemia, unspecified: Secondary | ICD-10-CM | POA: Insufficient documentation

## 2024-04-08 DIAGNOSIS — Z7982 Long term (current) use of aspirin: Secondary | ICD-10-CM | POA: Insufficient documentation

## 2024-04-08 DIAGNOSIS — I2510 Atherosclerotic heart disease of native coronary artery without angina pectoris: Secondary | ICD-10-CM

## 2024-04-08 DIAGNOSIS — Z79899 Other long term (current) drug therapy: Secondary | ICD-10-CM | POA: Diagnosis not present

## 2024-04-08 DIAGNOSIS — I2584 Coronary atherosclerosis due to calcified coronary lesion: Secondary | ICD-10-CM | POA: Insufficient documentation

## 2024-04-08 DIAGNOSIS — Z8249 Family history of ischemic heart disease and other diseases of the circulatory system: Secondary | ICD-10-CM | POA: Diagnosis not present

## 2024-04-08 DIAGNOSIS — I10 Essential (primary) hypertension: Secondary | ICD-10-CM | POA: Insufficient documentation

## 2024-04-08 HISTORY — PX: CORONARY PRESSURE/FFR STUDY: CATH118243

## 2024-04-08 HISTORY — PX: LEFT HEART CATH AND CORONARY ANGIOGRAPHY: CATH118249

## 2024-04-08 LAB — POCT ACTIVATED CLOTTING TIME: Activated Clotting Time: 423 s

## 2024-04-08 SURGERY — LEFT HEART CATH AND CORONARY ANGIOGRAPHY
Anesthesia: LOCAL

## 2024-04-08 MED ORDER — SODIUM CHLORIDE 0.9% FLUSH: 3.0000 mL | Freq: Two times a day (BID) | INTRAVENOUS | Status: DC

## 2024-04-08 MED ORDER — IOHEXOL 350 MG/ML SOLN
INTRAVENOUS | Status: DC | PRN
  Administered 2024-04-08: 60 mL

## 2024-04-08 MED ORDER — MIDAZOLAM HCL 2 MG/2ML IJ SOLN
INTRAMUSCULAR | Status: AC
  Filled 2024-04-08: qty 2

## 2024-04-08 MED ORDER — FENTANYL CITRATE (PF) 100 MCG/2ML IJ SOLN
INTRAMUSCULAR | Status: AC
  Filled 2024-04-08: qty 2

## 2024-04-08 MED ORDER — MIDAZOLAM HCL 2 MG/2ML IJ SOLN
INTRAMUSCULAR | Status: DC | PRN
  Administered 2024-04-08: 2 mg via INTRAVENOUS

## 2024-04-08 MED ORDER — VERAPAMIL HCL 2.5 MG/ML IV SOLN
INTRAVENOUS | Status: DC | PRN
  Administered 2024-04-08: 10 mL via INTRA_ARTERIAL

## 2024-04-08 MED ORDER — SODIUM CHLORIDE 0.9 % IV SOLN: 250.0000 mL | INTRAVENOUS | Status: DC | PRN

## 2024-04-08 MED ORDER — ADENOSINE (DIAGNOSTIC) 140MCG/KG/MIN
INTRAVENOUS | Status: DC | PRN
  Administered 2024-04-08: 140 ug/kg/min via INTRAVENOUS

## 2024-04-08 MED ORDER — ONDANSETRON HCL 4 MG/2ML IJ SOLN
4.0000 mg | Freq: Four times a day (QID) | INTRAMUSCULAR | Status: DC | PRN
Start: 2024-04-08 — End: 2024-04-08

## 2024-04-08 MED ORDER — ATROPINE SULFATE 1 MG/10ML IJ SOSY
PREFILLED_SYRINGE | INTRAMUSCULAR | Status: AC
  Filled 2024-04-08: qty 10

## 2024-04-08 MED ORDER — HEPARIN SODIUM (PORCINE) 1000 UNIT/ML IJ SOLN
INTRAMUSCULAR | Status: DC | PRN
  Administered 2024-04-08: 3500 [IU] via INTRAVENOUS
  Administered 2024-04-08: 4000 [IU] via INTRAVENOUS

## 2024-04-08 MED ORDER — LIDOCAINE HCL (PF) 1 % IJ SOLN
INTRAMUSCULAR | Status: AC
  Filled 2024-04-08: qty 30

## 2024-04-08 MED ORDER — SODIUM CHLORIDE 0.9 % WEIGHT BASED INFUSION: 3.0000 mL/kg/h | INTRAVENOUS | Status: AC

## 2024-04-08 MED ORDER — SODIUM CHLORIDE 0.9 % WEIGHT BASED INFUSION: 1.0000 mL/kg/h | INTRAVENOUS | Status: DC

## 2024-04-08 MED ORDER — LABETALOL HCL 5 MG/ML IV SOLN: 10.0000 mg | INTRAVENOUS | Status: DC | PRN

## 2024-04-08 MED ORDER — ADENOSINE 12 MG/4ML IV SOLN
INTRAVENOUS | Status: AC
Start: 2024-04-08 — End: ?
  Filled 2024-04-08: qty 16

## 2024-04-08 MED ORDER — ACETAMINOPHEN 325 MG PO TABS: 650.0000 mg | ORAL_TABLET | ORAL | Status: DC | PRN

## 2024-04-08 MED ORDER — HYDRALAZINE HCL 20 MG/ML IJ SOLN: 10.0000 mg | INTRAMUSCULAR | Status: DC | PRN

## 2024-04-08 MED ORDER — LIDOCAINE HCL (PF) 1 % IJ SOLN
INTRAMUSCULAR | Status: DC | PRN
  Administered 2024-04-08: 2 mL

## 2024-04-08 MED ORDER — HEPARIN (PORCINE) IN NACL 2000-0.9 UNIT/L-% IV SOLN
INTRAVENOUS | Status: DC | PRN
  Administered 2024-04-08: 1000 mL

## 2024-04-08 MED ORDER — SODIUM CHLORIDE 0.9% FLUSH: 3.0000 mL | INTRAVENOUS | Status: DC | PRN

## 2024-04-08 MED ORDER — VERAPAMIL HCL 2.5 MG/ML IV SOLN
INTRAVENOUS | Status: AC
Start: 2024-04-08 — End: ?
  Filled 2024-04-08: qty 2

## 2024-04-08 MED ORDER — HEPARIN SODIUM (PORCINE) 1000 UNIT/ML IJ SOLN
INTRAMUSCULAR | Status: AC
  Filled 2024-04-08: qty 10

## 2024-04-08 MED ORDER — FENTANYL CITRATE (PF) 100 MCG/2ML IJ SOLN
INTRAMUSCULAR | Status: DC | PRN
  Administered 2024-04-08: 25 ug via INTRAVENOUS

## 2024-04-08 MED ORDER — ASPIRIN 81 MG PO CHEW
81.0000 mg | CHEWABLE_TABLET | ORAL | Status: DC
Start: 2024-04-09 — End: 2024-04-08

## 2024-04-08 SURGICAL SUPPLY — 11 items
CATH INFINITI 5FR ANG PIGTAIL (CATHETERS) IMPLANT
CATH INFINITI AMBI 5FR TG (CATHETERS) IMPLANT
CATH VISTA GUIDE 6FR XB3.5 EPK (CATHETERS) IMPLANT
DEVICE RAD COMP TR BAND LRG (VASCULAR PRODUCTS) IMPLANT
GLIDESHEATH SLEND SS 6F .021 (SHEATH) IMPLANT
GUIDEWIRE INQWIRE 1.5J.035X260 (WIRE) IMPLANT
GUIDEWIRE PRESSURE X 175 (WIRE) IMPLANT
KIT ESSENTIALS PG (KITS) IMPLANT
PACK CARDIAC CATHETERIZATION (CUSTOM PROCEDURE TRAY) ×1 IMPLANT
SET ATX-X65L (MISCELLANEOUS) IMPLANT
SHEATH PROBE COVER 6X72 (BAG) IMPLANT

## 2024-04-08 NOTE — Interval H&P Note (Signed)
 History and Physical Interval Note:  04/08/2024 11:18 AM  Charolette Copier  has presented today for surgery, with the diagnosis of abnormal CT.  The various methods of treatment have been discussed with the patient and family. After consideration of risks, benefits and other options for treatment, the patient has consented to  Procedure(s): LEFT HEART CATH AND CORONARY ANGIOGRAPHY (N/A) as a surgical intervention.  The patient's history has been reviewed, patient examined, no change in status, stable for surgery.  I have reviewed the patient's chart and labs.  Questions were answered to the patient's satisfaction.    Cath Lab Visit (complete for each Cath Lab visit)  Clinical Evaluation Leading to the Procedure:   ACS: No.  Non-ACS:    Anginal Classification: No Symptoms  Anti-ischemic medical therapy: Minimal Therapy (1 class of medications)  Non-Invasive Test Results: Intermediate-risk stress test findings: cardiac mortality 1-3%/year  Prior CABG: No previous CABG     Randene Bustard

## 2024-04-08 NOTE — Progress Notes (Signed)
 Blood pressure dropped (see vitals flowsheet), patient became nauseous, pale and clammy. Lasted about 10 minutes, did not lose consciousness. Reports feeling better at present, blood pressure also improved.

## 2024-04-08 NOTE — Progress Notes (Addendum)
 Client c/o nausea when saw bleeding at radial site; color pale; heart rate decreased to 45; IV fluids started at 999cc/hr; client given soda and crackers; heart rate increased to 70's and color pink now; no c/o nausea now; Loris Ros, NP notified and no new orders noted

## 2024-04-08 NOTE — Brief Op Note (Signed)
 04/08/2024 12:55 PM  NAME: Gary Castaneda   MRN: [id number]  Primary Care Provider:  Jearlean Mince, PA-C Primary Cardiologist:  Dorothye Gathers, MD  SURGEON:  Surgeons and Role:    * Arleen Lacer, MD - Primary  PROCEDURE:  Procedure(s): LEFT HEART CATH AND CORONARY ANGIOGRAPHY (N/A) CORONARY PRESSURE/FFR STUDY (N/A)  PATIENT:  Gary Castaneda  53 y.o. male with a PMH of coronary calcifications, family history of early CAD, HTN, HLD who was seen for for pre-LHC workup based on abnormal coronary CTA revealing a CT FFR positive lesions in the LAD and RCA as well as unable to model the LCx. He has been relatively asymptomatic but has a significant family history of premature CAD in the Coronary Calcium  Score 2855.Gary Castaneda   PRE-OPERATIVE DIAGNOSIS:  Abnormal CT  POST-OPERATIVE DIAGNOSIS:   Severe multivessel CAD: Multiple lesions throughout the RCA that was known to be positive by FFRCT: Proximal to mid 40%, mid 70% focal eccentric and 70% discrete focal distal RCA with 70% stenosis just prior to the PAV takeoff prior to the PDA. Left Main normal:  Ostial LAD 70% stenosis with proximal FFR/RFR borderline however with the wire halfway down the artery after the next bend RFR is positive to 0.83.  The entire proximal vessel is calcified Moderate caliber RI with 90% eccentric calcified lesion but otherwise normal distal target Proximal RCA eccentric 90% stenosis.  The vessel then gives off a small OM 1 before bifurcating into OM 2 and OM 3 Normal LVEF (55 to 65%) and normal EDP  PROCEDURE PERFORMED Time Out: Verified patient identification, verified procedure, site/side was marked, verified correct patient position, special equipment/implants available, medications/allergies/relevent history reviewed, required imaging and test results available. Performed.  Access:  Right Radial Artery: 6 Fr sheath -- Seldinger technique using Micropuncture Kit -- Direct ultrasound guidance used.  Permanent  image obtained and placed on chart. -- 10 mL radial cocktail IA; 3500 units IV Heparin  Left Heart Catheterization: 5 and 6 Fr Catheters advanced or exchanged over a J-wire under direct fluoroscopic guidance into the ascending aorta; TIG 4.0 catheter advanced first.  * LV Hemodynamics (LV Gram): Ankle-brachial catheter * Left & Right Coronary Artery Cineangiography: TIG 4.0 catheter   Review of initial angiography revealed: Severe multivessel disease as reviewed  Preparations are made for RFR-FFR measurement of the LAD after discussion with Dr. Arlester Ladd - Additional bolus of IV heparin 4000's administered to achieve and maintain ACT> 250 seconds  RFR-FFR performed: 6 French XB 3.5 guide catheter, pressure X wire => initial RFR was borderline at 0.91 just at the proximal LAD.  FFR was performed with the lowest number of 0.83, however the wire was then advanced forward and RFR reevaluated in the mid vessel and was 0.83 distally.  Pullback was performed with the area of significance being at the more proximal bend with borderline significance in the very ostial lesion.  However with tapered disease in the LAD by the mid vessel it was significant  Upon completion of Angiogaphy, the catheter was removed completely out of the body over a wire, without complication.  Radial sheath removed in the Cardiac Catheterization lab with TR Band placed for hemostasis.  TR Band: 1245 Hours; 12 mL air  MEDICATIONS SQ Lidocaine 3 mL Radial Cocktail: 3 mg Verapmil in 10 mL NS Heparin: Total 7500 units IC adenosine 140 mcg/KG/min for 2:30 min   ANESTHESIA:   local and IV sedation: 3 mL SQ lidocaine; 2 mg Versed, 25 mcg  fentanyl  EBL:  <50 mL  PATIENT DISPOSITION:  PACU - hemodynamically stable.  DICTATION: .Note written in EPIC  PLAN OF CARE: Discharge to home after PACU => patient is already on optimal medical management.  Will discussed with the patient in heart team meeting later this week and  tentatively referred to CVTS for CABG.   Delay start of Pharmacological VTE agent (>24hrs) due to surgical blood loss or risk of bleeding: not applicable    Randene Bustard, MD

## 2024-04-09 ENCOUNTER — Ambulatory Visit (HOSPITAL_COMMUNITY): Attending: Cardiology

## 2024-04-09 ENCOUNTER — Ambulatory Visit: Payer: Self-pay | Admitting: Cardiology

## 2024-04-09 ENCOUNTER — Telehealth: Payer: Self-pay | Admitting: Cardiology

## 2024-04-09 DIAGNOSIS — I2510 Atherosclerotic heart disease of native coronary artery without angina pectoris: Secondary | ICD-10-CM | POA: Insufficient documentation

## 2024-04-09 DIAGNOSIS — I2584 Coronary atherosclerosis due to calcified coronary lesion: Secondary | ICD-10-CM | POA: Insufficient documentation

## 2024-04-09 LAB — ECHOCARDIOGRAM COMPLETE
Area-P 1/2: 4.46 cm2
S' Lateral: 2.75 cm

## 2024-04-09 NOTE — Telephone Encounter (Signed)
 Discussed cath results with

## 2024-04-11 ENCOUNTER — Ambulatory Visit: Payer: Self-pay | Admitting: Emergency Medicine

## 2024-04-11 NOTE — Heart Team MDD (Signed)
   Heart Team Multi-Disciplinary Discussion  Patient: Gary Castaneda  DOB: 1971/06/24  MRN: [id number]   Date: 04/11/2024  1:09 PM    Attendees: Interventional Cardiology: Randene Bustard, MD Peter Swaz[place], MD Antionette Kirks, MD Maisie Scotland, MD Sammy Crisp, MD Burney Carter, MD Arnoldo Lapping, MD  Cardiothoracic Surgery: Starleen Eastern, MD   Additional Attendees: Vertell Gory, MD   Patient History: 53 y.o. male who denies chest pain or exertional symptoms. Severe family history of premature CAD. He underwent a coronary CTA on 03/31/2024 which indicated an increase in his calcium  score from 1130 to 2855, with significant blockage and decreased blood flow in the LAD and right coronary artery, studies sent for FFR with analysis of FFR showing  decreased flow down the LAD as well as RCA. Cardiac cath 04/08/24. Currently, he is on Repatha  and Crestor . He has a family history of coronary disease; his father had a heart attack and quintuple bypass surgery in his fifties. Patient denies chest pain, palpitations, dyspnea, PND, orthopnea, nausea, vomiting, dizziness, syncope, edema, weight gain, or early satiety.    Risk Factors: Hypertension Hyperlipidemia Additional Risk Factors: family hx of premature CAD.      Review of Prior Angiography and PCI Procedures: Cardiac cath images from 04/08/2024, were reviewed and discussed including: Severe multivessel CAD: Multiple lesions throughout the RCA that was known to be positive by FFRCT: Proximal to mid 40%, mid 70% focal eccentric and 70% discrete focal distal RCA with 70% stenosis just prior to the PAV takeoff prior to the PDA. Left Main normal, Ostial LAD 70% stenosis with proximal FFR/RFR borderline however with the wire halfway down the artery after the next bend RFR is positive to 0.83.  The entire proximal vessel is calcified. Moderate caliber RI with 90% eccentric calcified lesion but otherwise normal distal target. Proximal RCA  eccentric 90% stenosis. The vessel then gives off a small OM 1 before bifurcating into OM 2 and OM 3.     Discussion: Following the patient's case presentation, the Heart Team engaged in a comprehensive discussion evaluating treatment strategies, including ongoing guideline-directed medical therapy (GDMT) versus referral to cardiothoracic surgery (CTS) for consideration of coronary artery bypass grafting (CABG). After thorough deliberation, the consensus was to proceed with additional genetic and lipid panel testing to further elucidate the patient's risk profile. Given the patient is currently asymptomatic and already optimized on GDMT, the team emphasized the importance of incorporating the patient's values and preferences through shared decision-making-particularly in light of his expressed concern regarding his family history of cardiovascular disease.    Additional Recommendations/Special Instructions: Shared decision making with patient    Andreas Kays, RN  04/11/2024 1:09 PM

## 2024-04-17 NOTE — H&P (View-Only) (Signed)
 [address]       [place] [address]             [phone]        [facility] Medical Record #[id number] Date of Birth: [birth year]  Referring: Arleen Lacer, MD Primary Care: Jearlean Mince, PA-C Primary Cardiologist:Mark Renna Cary, MD  Chief Complaint:    Chief Complaint  Patient presents with   Coronary Artery Disease    New patient consult, CATH 5/13, CT Coronary 5/5, ECHO 5/14    History of Present Illness:     [name] is a 53 y.o. 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  He has a strong family history of early cardiac disease, and has been followed closely since his 24s.  He underwent a coronary CT, which was then followed by a LHC recently.  Overall, he denies any anginal symptoms.  He exercises on the elliptical for 45 minutes at a time without problems.  His biggest complaint is the fact that his legs occasionally feel heavy.  He does endorse a long history of vasovagal episodes, and recently had one during his work-up after looking at blood.  At the time his blood pressure dropped to the 70s.  He also passed out recently in the shower.      Past Medical and Surgical History: Previous Chest Surgery: no Previous Chest Radiation: no Diabetes Mellitus: no.  HbA1C pending Creatinine:  Lab Results  Component Value Date   CREATININE 1.07 04/03/2024     Past Medical History:  Diagnosis Date   HTN (hypertension)    Hyperlipidemia LDL goal < 100     Past Surgical History:  Procedure Laterality Date   CORONARY PRESSURE/FFR STUDY N/A 04/08/2024   Procedure: CORONARY PRESSURE/FFR STUDY;  Surgeon: Arleen Lacer, MD;  Location: [facility] INVASIVE CV LAB;  Service: Cardiovascular;  Laterality: N/A;   LEFT HEART CATH AND CORONARY ANGIOGRAPHY N/A 04/08/2024   Procedure: LEFT HEART CATH AND CORONARY ANGIOGRAPHY;  Surgeon: Arleen Lacer, MD;  Location: [facility] INVASIVE CV LAB;  Service: Cardiovascular;  Laterality:  N/A;    Social History:  Social History   Tobacco Use  Smoking Status Never  Smokeless Tobacco Never    Social History   Substance and Sexual Activity  Alcohol Use No     No Known Allergies    Current Outpatient Medications  Medication Sig Dispense Refill   amLODipine (NORVASC) 10 MG tablet Take 10 mg by mouth daily.     aspirin  EC 81 MG tablet Take 81 mg by mouth daily.     Evolocumab  with Infusor (REPATHA  PUSHTRONEX SYSTEM) 420 MG/3.5ML SOCT Inject 420 mg into the skin every 30 (thirty) days. (Patient taking differently: Inject 140 mg into the skin every 30 (thirty) days.) 3.5 mL 3   ezetimibe (ZETIA) 10 MG tablet Take 10 mg by mouth daily.     lisinopril (ZESTRIL) 20 MG tablet Take 20 mg by mouth daily.     nebivolol  (BYSTOLIC ) 5 MG tablet Take 1 tablet (5 mg total) by mouth daily. Must schedule follow up appt for further refills [id number] 1st attempt 30 tablet 0   REPATHA  SURECLICK 140 MG/ML SOAJ Inject 1 mL into the skin every 14 (fourteen) days.     rosuvastatin  (CRESTOR ) 20 MG tablet TAKE (1) TABLET DAILY AT BEDTIME. 90 tablet 0   No current facility-administered medications for this visit.    (Not in a hospital  admission)   Family History  Problem Relation Age of Onset   Kidney disease Father    Hypertension Unknown    Diabetes Unknown    Heart attack Unknown    Stroke Unknown      Review of Systems:   Review of Systems  Constitutional:  Negative for malaise/fatigue.  Respiratory:  Negative for shortness of breath.   Cardiovascular:  Negative for chest pain.  Neurological:  Positive for dizziness and loss of consciousness.      Physical Exam: BP 131/83 (BP Location: Right Arm, Patient Position: Sitting, Cuff Size: Normal)   Pulse 70   Resp 20   Ht 5\' 6"  (1.676 m)   Wt 153 lb 6.4 oz (69.6 kg)   SpO2 99% Comment: RA  BMI 24.76 kg/m  Physical Exam Constitutional:      General: He is not in acute distress.    Appearance: He is not  ill-appearing.  HENT:     Head: Normocephalic and atraumatic.  Eyes:     Extraocular Movements: Extraocular movements intact.  Cardiovascular:     Rate and Rhythm: Normal rate.  Pulmonary:     Effort: Pulmonary effort is normal.  Abdominal:     General: Abdomen is flat. There is no distension.  Musculoskeletal:        General: Normal range of motion.     Cervical back: Normal range of motion.  Skin:    General: Skin is warm and dry.  Neurological:     General: No focal deficit present.     Mental Status: He is alert and oriented to person, place, and time.       Diagnostic Studies & Laboratory data:    Left Heart Catherization:  Intervention Echo: IMPRESSIONS     1. Left ventricular ejection fraction, by estimation, is 60 to 65%. Left  ventricular ejection fraction by 3D volume is 65 %. The left ventricle has  normal function. The left ventricle has no regional wall motion  abnormalities. Left ventricular diastolic   parameters were normal.   2. Right ventricular systolic function is normal. The right ventricular  size is normal.   3. The mitral valve is normal in structure. Trivial mitral valve  regurgitation. No evidence of mitral stenosis.   4. The aortic valve is tricuspid. Aortic valve regurgitation is not  visualized. No aortic stenosis is present.   5. The inferior vena cava is normal in size with greater than 50%  respiratory variability, suggesting right atrial pressure of 3 mmHg.     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  Although he denies any anginal symptoms, I think that his vagal episodes are concerning for coronary obstruction given his degree of obstructive disease.  I have recommended that he undergo CABG with use of the left radial artery.  He is agreeable to proceed.   Pda, om, ri, lad  I  spent 60 minutes counseling the patient face  to face.   Hilarie Lovely 04/18/2024 12:45 PM

## 2024-04-17 NOTE — Progress Notes (Signed)
 [address]       [place] [address]             [phone]        [place] [facility] Medical Record #[id number] Date of Birth: [birth year]  Referring: Arleen Lacer, MD Primary Care: Jearlean Mince, PA-C Primary Cardiologist:Mark Renna Cary, MD  Chief Complaint:    Chief Complaint  Patient presents with   Coronary Artery Disease    New patient consult, CATH 5/13, CT Coronary 5/5, ECHO 5/14    History of Present Illness:     Gary Castaneda is a 53 y.o. 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  He has a strong family history of early cardiac disease, and has been followed closely since his 24s.  He underwent a coronary CT, which was then followed by a LHC recently.  Overall, he denies any anginal symptoms.  He exercises on the elliptical for 45 minutes at a time without problems.  His biggest complaint is the fact that his legs occasionally feel heavy.  He does endorse a long history of vasovagal episodes, and recently had one during his work-up after looking at blood.  At the time his blood pressure dropped to the 70s.  He also passed out recently in the shower.      Past Medical and Surgical History: Previous Chest Surgery: no Previous Chest Radiation: no Diabetes Mellitus: no.  HbA1C pending Creatinine:  Lab Results  Component Value Date   CREATININE 1.07 04/03/2024     Past Medical History:  Diagnosis Date   HTN (hypertension)    Hyperlipidemia LDL goal < 100     Past Surgical History:  Procedure Laterality Date   CORONARY PRESSURE/FFR STUDY N/A 04/08/2024   Procedure: CORONARY PRESSURE/FFR STUDY;  Surgeon: Arleen Lacer, MD;  Location: [facility] INVASIVE CV LAB;  Service: Cardiovascular;  Laterality: N/A;   LEFT HEART CATH AND CORONARY ANGIOGRAPHY N/A 04/08/2024   Procedure: LEFT HEART CATH AND CORONARY ANGIOGRAPHY;  Surgeon: Arleen Lacer, MD;  Location: [facility] INVASIVE CV LAB;  Service: Cardiovascular;  Laterality:  N/A;    Social History:  Social History   Tobacco Use  Smoking Status Never  Smokeless Tobacco Never    Social History   Substance and Sexual Activity  Alcohol Use No     No Known Allergies    Current Outpatient Medications  Medication Sig Dispense Refill   amLODipine (NORVASC) 10 MG tablet Take 10 mg by mouth daily.     aspirin  EC 81 MG tablet Take 81 mg by mouth daily.     Evolocumab  with Infusor (REPATHA  PUSHTRONEX SYSTEM) 420 MG/3.5ML SOCT Inject 420 mg into the skin every 30 (thirty) days. (Patient taking differently: Inject 140 mg into the skin every 30 (thirty) days.) 3.5 mL 3   ezetimibe (ZETIA) 10 MG tablet Take 10 mg by mouth daily.     lisinopril (ZESTRIL) 20 MG tablet Take 20 mg by mouth daily.     nebivolol  (BYSTOLIC ) 5 MG tablet Take 1 tablet (5 mg total) by mouth daily. Must schedule follow up appt for further refills [id number] 1st attempt 30 tablet 0   REPATHA  SURECLICK 140 MG/ML SOAJ Inject 1 mL into the skin every 14 (fourteen) days.     rosuvastatin  (CRESTOR ) 20 MG tablet TAKE (1) TABLET DAILY AT BEDTIME. 90 tablet 0   No current facility-administered medications for this visit.    (Not in a hospital  admission)   Family History  Problem Relation Age of Onset   Kidney disease Father    Hypertension Unknown    Diabetes Unknown    Heart attack Unknown    Stroke Unknown      Review of Systems:   Review of Systems  Constitutional:  Negative for malaise/fatigue.  Respiratory:  Negative for shortness of breath.   Cardiovascular:  Negative for chest pain.  Neurological:  Positive for dizziness and loss of consciousness.      Physical Exam: BP 131/83 (BP Location: Right Arm, Patient Position: Sitting, Cuff Size: Normal)   Pulse 70   Resp 20   Ht 5\' 6"  (1.676 m)   Wt 153 lb 6.4 oz (69.6 kg)   SpO2 99% Comment: RA  BMI 24.76 kg/m  Physical Exam Constitutional:      General: He is not in acute distress.    Appearance: He is not  ill-appearing.  HENT:     Head: Normocephalic and atraumatic.  Eyes:     Extraocular Movements: Extraocular movements intact.  Cardiovascular:     Rate and Rhythm: Normal rate.  Pulmonary:     Effort: Pulmonary effort is normal.  Abdominal:     General: Abdomen is flat. There is no distension.  Musculoskeletal:        General: Normal range of motion.     Cervical back: Normal range of motion.  Skin:    General: Skin is warm and dry.  Neurological:     General: No focal deficit present.     Mental Status: He is alert and oriented to person, place, and time.       Diagnostic Studies & Laboratory data:    Left Heart Catherization:  Intervention Echo: IMPRESSIONS     1. Left ventricular ejection fraction, by estimation, is 60 to 65%. Left  ventricular ejection fraction by 3D volume is 65 %. The left ventricle has  normal function. The left ventricle has no regional wall motion  abnormalities. Left ventricular diastolic   parameters were normal.   2. Right ventricular systolic function is normal. The right ventricular  size is normal.   3. The mitral valve is normal in structure. Trivial mitral valve  regurgitation. No evidence of mitral stenosis.   4. The aortic valve is tricuspid. Aortic valve regurgitation is not  visualized. No aortic stenosis is present.   5. The inferior vena cava is normal in size with greater than 50%  respiratory variability, suggesting right atrial pressure of 3 mmHg.     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  Although he denies any anginal symptoms, I think that his vagal episodes are concerning for coronary obstruction given his degree of obstructive disease.  I have recommended that he undergo CABG with use of the left radial artery.  He is agreeable to proceed.   Pda, om, ri, lad  I  spent 60 minutes counseling the patient face  to face.   Hilarie Lovely 04/18/2024 12:45 PM

## 2024-04-18 ENCOUNTER — Encounter: Payer: Self-pay | Admitting: *Deleted

## 2024-04-18 ENCOUNTER — Ambulatory Visit
Attending: Thoracic Surgery (Cardiothoracic Vascular Surgery) | Admitting: Thoracic Surgery (Cardiothoracic Vascular Surgery)

## 2024-04-18 ENCOUNTER — Other Ambulatory Visit: Payer: Self-pay | Admitting: *Deleted

## 2024-04-18 VITALS — BP 131/83 | HR 70 | Resp 20 | Ht 66.0 in | Wt 153.4 lb

## 2024-04-18 DIAGNOSIS — I2510 Atherosclerotic heart disease of native coronary artery without angina pectoris: Secondary | ICD-10-CM

## 2024-04-18 NOTE — Progress Notes (Signed)
 Surgical Instructions   Your procedure is scheduled on Thursday Apr 24, 2024. Report to [facility] Main Entrance "A" at 8:45 A.M., then check in with the Admitting office. Any questions or running late day of surgery: call [phone]  Questions prior to your surgery date: call [phone], Monday-Friday, 8am-4pm. If you experience any cold or flu symptoms such as cough, fever, chills, shortness of breath, etc. between now and your scheduled surgery, please notify us  at the above number.     Remember:  Do not eat or drink after midnight the night before your surgery  Take these medicines the morning of surgery with A SIP OF WATER  amLODipine (NORVASC)  ezetimibe (ZETIA) nebivolol  (BYSTOLIC )    May take these medicines IF NEEDED:  DO NOT TAKE YOUR ASPIRIN  THE MORNING OF SURGERY  One week prior to surgery, STOP taking any Aleve, Naproxen, Ibuprofen, Motrin, Advil, Goody's, BC's, all herbal medications, fish oil, and non-prescription vitamins.                Do NOT Smoke (Tobacco/Vaping) for 24 hours prior to your procedure.  If you use a CPAP at night, you may bring your mask/headgear for your overnight stay.   You will be asked to remove any contacts, glasses, piercing's, hearing aid's, dentures/partials prior to surgery. Please bring cases for these items if needed.    Patients discharged the day of surgery will not be allowed to drive home, and someone needs to stay with them for 24 hours.  SURGICAL WAITING ROOM VISITATION Patients may have no more than 2 support people in the waiting area - these visitors may rotate.   Pre-op nurse will coordinate an appropriate time for 1 ADULT support person, who may not rotate, to accompany patient in pre-op.  Children under the age of 41 must have an adult with them who is not the patient and must remain in the main waiting area with an adult.  If the patient needs to stay at the hospital during part of their recovery, the visitor  guidelines for inpatient rooms apply.  Please refer to the [facility] website for the visitor guidelines for any additional information.   If you received a COVID test during your pre-op visit  it is requested that you wear a mask when out in public, stay away from anyone that may not be feeling well and notify your surgeon if you develop symptoms. If you have been in contact with anyone that has tested positive in the last 10 days please notify you surgeon.      Pre-operative CHG Bathing Instructions   You can play a key role in reducing the risk of infection after surgery. Your skin needs to be as free of germs as possible. You can reduce the number of germs on your skin by washing with CHG (chlorhexidine gluconate) soap before surgery. CHG is an antiseptic soap that kills germs and continues to kill germs even after washing.   DO NOT use if you have an allergy to chlorhexidine/CHG or antibacterial soaps. If your skin becomes reddened or irritated, stop using the CHG and notify one of our RNs at [phone].              TAKE A SHOWER THE NIGHT BEFORE SURGERY AND THE DAY OF SURGERY    Please keep in mind the following:  DO NOT shave, including legs and underarms, 48 hours prior to surgery.   You may shave your face before/day of surgery.  Place  clean sheets on your bed the night before surgery Use a clean washcloth (not used since being washed) for each shower. DO NOT sleep with pet's night before surgery.  CHG Shower Instructions:  Wash your face and private area with normal soap. If you choose to wash your hair, wash first with your normal shampoo.  After you use shampoo/soap, rinse your hair and body thoroughly to remove shampoo/soap residue.  Turn the water OFF and apply half the bottle of CHG soap to a CLEAN washcloth.  Apply CHG soap ONLY FROM YOUR NECK DOWN TO YOUR TOES (washing for 3-5 minutes)  DO NOT use CHG soap on face, private areas, open wounds, or sores.  Pay special  attention to the area where your surgery is being performed.  If you are having back surgery, having someone wash your back for you may be helpful. Wait 2 minutes after CHG soap is applied, then you may rinse off the CHG soap.  Pat dry with a clean towel  Put on clean pajamas    Additional instructions for the day of surgery: DO NOT APPLY any lotions, deodorants or cologne.   Do not wear jewelry Do not bring valuables to the hospital. [facility] is not responsible for valuables/personal belongings. Put on clean/comfortable clothes.  Please brush your teeth.  Ask your nurse before applying any prescription medications to the skin.

## 2024-04-22 ENCOUNTER — Ambulatory Visit (HOSPITAL_COMMUNITY)
Admission: RE | Admit: 2024-04-22 | Discharge: 2024-04-22 | Disposition: A | Discharge status: Home / Self Care | Source: Ambulatory Visit | Attending: Thoracic Surgery (Cardiothoracic Vascular Surgery)

## 2024-04-22 ENCOUNTER — Other Ambulatory Visit: Payer: Self-pay

## 2024-04-22 ENCOUNTER — Ambulatory Visit (HOSPITAL_BASED_OUTPATIENT_CLINIC_OR_DEPARTMENT_OTHER)
Admission: RE | Admit: 2024-04-22 | Discharge: 2024-04-22 | Disposition: A | Discharge status: Home / Self Care | Source: Ambulatory Visit | Attending: Thoracic Surgery (Cardiothoracic Vascular Surgery) | Admitting: Thoracic Surgery (Cardiothoracic Vascular Surgery)

## 2024-04-22 ENCOUNTER — Encounter (HOSPITAL_COMMUNITY): Payer: Self-pay

## 2024-04-22 ENCOUNTER — Encounter (HOSPITAL_COMMUNITY)
Admission: RE | Admit: 2024-04-22 | Discharge: 2024-04-22 | Disposition: A | Discharge status: Home / Self Care | Source: Ambulatory Visit | Attending: Thoracic Surgery (Cardiothoracic Vascular Surgery) | Admitting: Thoracic Surgery (Cardiothoracic Vascular Surgery)

## 2024-04-22 VITALS — BP 154/91 | HR 60 | Temp 98.0°F | Resp 19 | Ht 66.0 in | Wt 153.0 lb

## 2024-04-22 DIAGNOSIS — J90 Pleural effusion, not elsewhere classified: Secondary | ICD-10-CM | POA: Diagnosis not present

## 2024-04-22 DIAGNOSIS — D62 Acute posthemorrhagic anemia: Secondary | ICD-10-CM | POA: Diagnosis not present

## 2024-04-22 DIAGNOSIS — I2510 Atherosclerotic heart disease of native coronary artery without angina pectoris: Secondary | ICD-10-CM | POA: Diagnosis not present

## 2024-04-22 DIAGNOSIS — Z4682 Encounter for fitting and adjustment of non-vascular catheter: Secondary | ICD-10-CM | POA: Diagnosis not present

## 2024-04-22 DIAGNOSIS — E785 Hyperlipidemia, unspecified: Secondary | ICD-10-CM | POA: Diagnosis not present

## 2024-04-22 DIAGNOSIS — Z48812 Encounter for surgical aftercare following surgery on the circulatory system: Secondary | ICD-10-CM | POA: Diagnosis not present

## 2024-04-22 DIAGNOSIS — Z833 Family history of diabetes mellitus: Secondary | ICD-10-CM | POA: Diagnosis not present

## 2024-04-22 DIAGNOSIS — Z8249 Family history of ischemic heart disease and other diseases of the circulatory system: Secondary | ICD-10-CM | POA: Diagnosis not present

## 2024-04-22 DIAGNOSIS — I10 Essential (primary) hypertension: Secondary | ICD-10-CM | POA: Diagnosis not present

## 2024-04-22 DIAGNOSIS — Z7982 Long term (current) use of aspirin: Secondary | ICD-10-CM | POA: Diagnosis not present

## 2024-04-22 DIAGNOSIS — R918 Other nonspecific abnormal finding of lung field: Secondary | ICD-10-CM | POA: Diagnosis not present

## 2024-04-22 DIAGNOSIS — R112 Nausea with vomiting, unspecified: Secondary | ICD-10-CM | POA: Diagnosis not present

## 2024-04-22 DIAGNOSIS — D72829 Elevated white blood cell count, unspecified: Secondary | ICD-10-CM | POA: Diagnosis not present

## 2024-04-22 DIAGNOSIS — Z01818 Encounter for other preprocedural examination: Secondary | ICD-10-CM | POA: Diagnosis not present

## 2024-04-22 DIAGNOSIS — R5381 Other malaise: Secondary | ICD-10-CM | POA: Diagnosis not present

## 2024-04-22 DIAGNOSIS — Z79899 Other long term (current) drug therapy: Secondary | ICD-10-CM | POA: Diagnosis not present

## 2024-04-22 DIAGNOSIS — J9811 Atelectasis: Secondary | ICD-10-CM | POA: Diagnosis not present

## 2024-04-22 DIAGNOSIS — D696 Thrombocytopenia, unspecified: Secondary | ICD-10-CM | POA: Diagnosis not present

## 2024-04-22 DIAGNOSIS — Z841 Family history of disorders of kidney and ureter: Secondary | ICD-10-CM | POA: Diagnosis not present

## 2024-04-22 DIAGNOSIS — Z951 Presence of aortocoronary bypass graft: Secondary | ICD-10-CM | POA: Diagnosis not present

## 2024-04-22 DIAGNOSIS — Z452 Encounter for adjustment and management of vascular access device: Secondary | ICD-10-CM | POA: Diagnosis not present

## 2024-04-22 DIAGNOSIS — J939 Pneumothorax, unspecified: Secondary | ICD-10-CM | POA: Diagnosis not present

## 2024-04-22 HISTORY — DX: Atherosclerotic heart disease of native coronary artery without angina pectoris: I25.10

## 2024-04-22 LAB — APTT: aPTT: 31 s (ref 24–36)

## 2024-04-22 LAB — URINALYSIS, ROUTINE W REFLEX MICROSCOPIC
Bilirubin Urine: NEGATIVE
Glucose, UA: NEGATIVE mg/dL
Hgb urine dipstick: NEGATIVE
Ketones, ur: NEGATIVE mg/dL
Leukocytes,Ua: NEGATIVE
Nitrite: NEGATIVE
Protein, ur: NEGATIVE mg/dL
Specific Gravity, Urine: 1.014 (ref 1.005–1.030)
pH: 6 (ref 5.0–8.0)

## 2024-04-22 LAB — VAS US DOPPLER PRE CABG: Left ABI: 1.21

## 2024-04-22 LAB — COMPREHENSIVE METABOLIC PANEL WITH GFR
ALT: 44 U/L (ref 0–44)
AST: 48 U/L — ABNORMAL HIGH (ref 15–41)
Albumin: 4.2 g/dL (ref 3.5–5.0)
Alkaline Phosphatase: 47 U/L (ref 38–126)
Anion gap: 8 (ref 5–15)
BUN: 14 mg/dL (ref 6–20)
CO2: 27 mmol/L (ref 22–32)
Calcium: 9.1 mg/dL (ref 8.9–10.3)
Chloride: 102 mmol/L (ref 98–111)
Creatinine, Ser: 1.12 mg/dL (ref 0.61–1.24)
GFR, Estimated: >60 mL/min (ref >60)
Glucose, Bld: 99 mg/dL (ref 70–99)
Potassium: 3.7 mmol/L (ref 3.5–5.1)
Sodium: 137 mmol/L (ref 135–145)
Total Bilirubin: 0.7 mg/dL (ref 0.0–1.2)
Total Protein: 7 g/dL (ref 6.5–8.1)

## 2024-04-22 LAB — CBC
HCT: 41.9 % (ref 39.0–52.0)
Hemoglobin: 14.4 g/dL (ref 13.0–17.0)
MCH: 33.5 pg (ref 26.0–34.0)
MCHC: 34.4 g/dL (ref 30.0–36.0)
MCV: 97.4 fL (ref 80.0–100.0)
Platelets: 266 10*3/uL (ref 150–400)
RBC: 4.3 MIL/uL (ref 4.22–5.81)
RDW: 11.4 % — ABNORMAL LOW (ref 11.5–15.5)
WBC: 5.6 10*3/uL (ref 4.0–10.5)
nRBC: 0 % (ref 0.0–0.2)

## 2024-04-22 LAB — PROTIME-INR
INR: 1 (ref 0.8–1.2)
Prothrombin Time: 13 s (ref 11.4–15.2)

## 2024-04-22 LAB — SURGICAL PCR SCREEN
MRSA, PCR: NEGATIVE
Staphylococcus aureus: NEGATIVE

## 2024-04-22 LAB — TYPE AND SCREEN
ABO/RH(D): O POS
Antibody Screen: NEGATIVE

## 2024-04-22 LAB — HEMOGLOBIN A1C
Hgb A1c MFr Bld: 4.9 % (ref 4.8–5.6)
Mean Plasma Glucose: 93.93 mg/dL

## 2024-04-22 NOTE — Progress Notes (Addendum)
 PCP - Kimber Pencil PA-C Cardiologist - Ramond Burnet  PPM/ICD - denies Device Orders -  Rep Notified -   Chest x-ray - 04/23/27 EKG - 04/09/24 Stress Test - 08/01/18 ECHO - 04/09/24 Cardiac Cath - 04/08/24  Sleep Study - denies CPAP -   Fasting Blood Sugar -  Checks Blood Sugar _____ times a day  Last dose of GLP1 agonist-  na GLP1 instructions:   Blood Thinner Instructions:na Aspirin  Instructions:hold DOS  ERAS Protcol -na PRE-SURGERY Ensure or G2-   COVID TEST- na   Anesthesia review: yes-CAD,HTN  Patient denies shortness of breath, fever, cough and chest pain at PAT appointment   All instructions explained to the patient, with a verbal understanding of the material. Patient agrees to go over the instructions while at home for a better understanding. The opportunity to ask questions was provided.

## 2024-04-23 MED ORDER — MANNITOL 20 % IV SOLN
INTRAVENOUS | Status: DC
  Filled 2024-04-23: qty 13

## 2024-04-23 MED ORDER — VANCOMYCIN HCL 1250 MG/250ML IV SOLN
1250.0000 mg | INTRAVENOUS | Status: AC
  Administered 2024-04-24: 1250 mg via INTRAVENOUS
  Filled 2024-04-23: qty 250

## 2024-04-23 MED ORDER — INSULIN REGULAR(HUMAN) IN NACL 100-0.9 UT/100ML-% IV SOLN
INTRAVENOUS | Status: AC
  Administered 2024-04-24: 1.2 [IU]/h via INTRAVENOUS
  Filled 2024-04-23: qty 100

## 2024-04-23 MED ORDER — PHENYLEPHRINE HCL-NACL 20-0.9 MG/250ML-% IV SOLN
30.0000 ug/min | INTRAVENOUS | Status: AC
  Administered 2024-04-24: 10 ug/min via INTRAVENOUS
  Filled 2024-04-23: qty 250

## 2024-04-23 MED ORDER — NOREPINEPHRINE 4 MG/250ML-% IV SOLN
0.0000 ug/min | INTRAVENOUS | Status: DC
  Filled 2024-04-23: qty 250

## 2024-04-23 MED ORDER — PLASMA-LYTE A IV SOLN
INTRAVENOUS | Status: DC
  Filled 2024-04-23: qty 2.5

## 2024-04-23 MED ORDER — DEXMEDETOMIDINE HCL IN NACL 400 MCG/100ML IV SOLN
0.1000 ug/kg/h | INTRAVENOUS | Status: AC
  Administered 2024-04-24: .3 ug/kg/h via INTRAVENOUS
  Filled 2024-04-23: qty 100

## 2024-04-23 MED ORDER — CEFAZOLIN SODIUM-DEXTROSE 2-4 GM/100ML-% IV SOLN
2.0000 g | INTRAVENOUS | Status: AC
  Administered 2024-04-24 (×2): 2 g via INTRAVENOUS
  Filled 2024-04-23: qty 100

## 2024-04-23 MED ORDER — MILRINONE LACTATE IN DEXTROSE 20-5 MG/100ML-% IV SOLN
0.3000 ug/kg/min | INTRAVENOUS | Status: DC
  Filled 2024-04-23: qty 100

## 2024-04-23 MED ORDER — TRANEXAMIC ACID 1000 MG/10ML IV SOLN
1.5000 mg/kg/h | INTRAVENOUS | Status: DC
  Filled 2024-04-23: qty 25

## 2024-04-23 MED ORDER — CEFAZOLIN SODIUM-DEXTROSE 2-4 GM/100ML-% IV SOLN
2.0000 g | INTRAVENOUS | Status: DC
  Filled 2024-04-23: qty 100

## 2024-04-23 MED ORDER — TRANEXAMIC ACID (OHS) BOLUS VIA INFUSION
15.0000 mg/kg | INTRAVENOUS | Status: AC
  Administered 2024-04-24: 1041 mg via INTRAVENOUS
  Filled 2024-04-23: qty 1041

## 2024-04-23 MED ORDER — NITROGLYCERIN IN D5W 200-5 MCG/ML-% IV SOLN
2.0000 ug/min | INTRAVENOUS | Status: DC
  Filled 2024-04-23: qty 250

## 2024-04-23 MED ORDER — EPINEPHRINE HCL 5 MG/250ML IV SOLN IN NS
0.0000 ug/min | INTRAVENOUS | Status: DC
  Filled 2024-04-23: qty 250

## 2024-04-23 MED ORDER — POTASSIUM CHLORIDE 2 MEQ/ML IV SOLN
80.0000 meq | INTRAVENOUS | Status: DC
  Filled 2024-04-23: qty 40

## 2024-04-23 MED ORDER — HEPARIN 30,000 UNITS/1000 ML (OHS) CELLSAVER SOLUTION
Status: DC
  Filled 2024-04-23: qty 1000

## 2024-04-23 MED ORDER — TRANEXAMIC ACID (OHS) PUMP PRIME SOLUTION
2.0000 mg/kg | INTRAVENOUS | Status: DC
  Filled 2024-04-23: qty 1.39

## 2024-04-24 ENCOUNTER — Inpatient Hospital Stay (HOSPITAL_COMMUNITY): Payer: Self-pay | Admitting: Vascular Surgery

## 2024-04-24 ENCOUNTER — Inpatient Hospital Stay (HOSPITAL_COMMUNITY)

## 2024-04-24 ENCOUNTER — Other Ambulatory Visit: Payer: Self-pay

## 2024-04-24 ENCOUNTER — Inpatient Hospital Stay (HOSPITAL_COMMUNITY)
Admission: RE | Admit: 2024-04-24 | Discharge: 2024-04-28 | DRG: 236 | Disposition: A | Discharge status: Home / Self Care | Attending: Thoracic Surgery (Cardiothoracic Vascular Surgery) | Admitting: Thoracic Surgery (Cardiothoracic Vascular Surgery)

## 2024-04-24 ENCOUNTER — Inpatient Hospital Stay (HOSPITAL_COMMUNITY)
Admission: RE | Admit: 2024-04-24 | Discharge: 2024-04-24 | Disposition: A | Discharge status: Home / Self Care | Source: Ambulatory Visit | Attending: Thoracic Surgery (Cardiothoracic Vascular Surgery) | Admitting: Thoracic Surgery (Cardiothoracic Vascular Surgery)

## 2024-04-24 ENCOUNTER — Encounter (HOSPITAL_COMMUNITY)
Admission: RE | Disposition: A | Discharge status: Home / Self Care | Payer: Self-pay | Source: Home / Self Care | Attending: Thoracic Surgery (Cardiothoracic Vascular Surgery)

## 2024-04-24 ENCOUNTER — Inpatient Hospital Stay (HOSPITAL_COMMUNITY): Payer: Self-pay

## 2024-04-24 ENCOUNTER — Encounter (HOSPITAL_COMMUNITY): Payer: Self-pay | Admitting: Thoracic Surgery (Cardiothoracic Vascular Surgery)

## 2024-04-24 DIAGNOSIS — Z7982 Long term (current) use of aspirin: Secondary | ICD-10-CM | POA: Diagnosis not present

## 2024-04-24 DIAGNOSIS — I10 Essential (primary) hypertension: Secondary | ICD-10-CM | POA: Diagnosis present

## 2024-04-24 DIAGNOSIS — D62 Acute posthemorrhagic anemia: Secondary | ICD-10-CM | POA: Diagnosis not present

## 2024-04-24 DIAGNOSIS — I2510 Atherosclerotic heart disease of native coronary artery without angina pectoris: Secondary | ICD-10-CM

## 2024-04-24 DIAGNOSIS — R112 Nausea with vomiting, unspecified: Secondary | ICD-10-CM | POA: Diagnosis not present

## 2024-04-24 DIAGNOSIS — Z833 Family history of diabetes mellitus: Secondary | ICD-10-CM | POA: Diagnosis not present

## 2024-04-24 DIAGNOSIS — D696 Thrombocytopenia, unspecified: Secondary | ICD-10-CM | POA: Diagnosis not present

## 2024-04-24 DIAGNOSIS — J9811 Atelectasis: Secondary | ICD-10-CM | POA: Diagnosis not present

## 2024-04-24 DIAGNOSIS — Z79899 Other long term (current) drug therapy: Secondary | ICD-10-CM

## 2024-04-24 DIAGNOSIS — Z841 Family history of disorders of kidney and ureter: Secondary | ICD-10-CM

## 2024-04-24 DIAGNOSIS — Z951 Presence of aortocoronary bypass graft: Principal | ICD-10-CM

## 2024-04-24 DIAGNOSIS — D72829 Elevated white blood cell count, unspecified: Secondary | ICD-10-CM | POA: Diagnosis not present

## 2024-04-24 DIAGNOSIS — E785 Hyperlipidemia, unspecified: Secondary | ICD-10-CM | POA: Diagnosis present

## 2024-04-24 DIAGNOSIS — Z8249 Family history of ischemic heart disease and other diseases of the circulatory system: Secondary | ICD-10-CM | POA: Diagnosis not present

## 2024-04-24 HISTORY — PX: RADIAL ARTERY HARVEST: SHX5067

## 2024-04-24 HISTORY — PX: INTRAOPERATIVE TRANSESOPHAGEAL ECHOCARDIOGRAM: SHX5062

## 2024-04-24 HISTORY — PX: CORONARY ARTERY BYPASS GRAFT: SHX141

## 2024-04-24 LAB — POCT I-STAT 7, (LYTES, BLD GAS, ICA,H+H)
Acid-Base Excess: 1 mmol/L (ref 0.0–2.0)
Acid-Base Excess: 1 mmol/L (ref 0.0–2.0)
Acid-base deficit: 1 mmol/L (ref 0.0–2.0)
Acid-base deficit: 6 mmol/L — ABNORMAL HIGH (ref 0.0–2.0)
Acid-base deficit: 2 mmol/L (ref 0.0–2.0)
Bicarbonate: 24.9 mmol/L (ref 20.0–28.0)
Bicarbonate: 23 mmol/L (ref 20.0–28.0)
Bicarbonate: 24.2 mmol/L (ref 20.0–28.0)
Bicarbonate: 18 mmol/L — ABNORMAL LOW (ref 20.0–28.0)
Bicarbonate: 24.5 mmol/L (ref 20.0–28.0)
Calcium, Ion: 1.19 mmol/L (ref 1.15–1.40)
Calcium, Ion: 1 mmol/L — ABNORMAL LOW (ref 1.15–1.40)
Calcium, Ion: 1.32 mmol/L (ref 1.15–1.40)
Calcium, Ion: 1.07 mmol/L — ABNORMAL LOW (ref 1.15–1.40)
Calcium, Ion: 1.14 mmol/L — ABNORMAL LOW (ref 1.15–1.40)
HCT: 35 % — ABNORMAL LOW (ref 39.0–52.0)
HCT: 26 % — ABNORMAL LOW (ref 39.0–52.0)
HCT: 26 % — ABNORMAL LOW (ref 39.0–52.0)
HCT: 23 % — ABNORMAL LOW (ref 39.0–52.0)
HCT: 28 % — ABNORMAL LOW (ref 39.0–52.0)
Hemoglobin: 11.9 g/dL — ABNORMAL LOW (ref 13.0–17.0)
Hemoglobin: 8.8 g/dL — ABNORMAL LOW (ref 13.0–17.0)
Hemoglobin: 8.8 g/dL — ABNORMAL LOW (ref 13.0–17.0)
Hemoglobin: 7.8 g/dL — ABNORMAL LOW (ref 13.0–17.0)
Hemoglobin: 9.5 g/dL — ABNORMAL LOW (ref 13.0–17.0)
O2 Saturation: 100 %
O2 Saturation: 100 %
O2 Saturation: 100 %
O2 Saturation: 99 %
O2 Saturation: 98 %
Patient temperature: 36.1
Patient temperature: 35.9
Potassium: 2.9 mmol/L — ABNORMAL LOW (ref 3.5–5.1)
Potassium: 3.6 mmol/L (ref 3.5–5.1)
Potassium: 3.4 mmol/L — ABNORMAL LOW (ref 3.5–5.1)
Potassium: 2.5 mmol/L — CL (ref 3.5–5.1)
Potassium: 3.5 mmol/L (ref 3.5–5.1)
Sodium: 138 mmol/L (ref 135–145)
Sodium: 139 mmol/L (ref 135–145)
Sodium: 139 mmol/L (ref 135–145)
Sodium: 145 mmol/L (ref 135–145)
Sodium: 143 mmol/L (ref 135–145)
TCO2: 26 mmol/L (ref 22–32)
TCO2: 24 mmol/L (ref 22–32)
TCO2: 25 mmol/L (ref 22–32)
TCO2: 19 mmol/L — ABNORMAL LOW (ref 22–32)
TCO2: 26 mmol/L (ref 22–32)
pCO2 arterial: 36.7 mmHg (ref 32–48)
pCO2 arterial: 32.9 mmHg (ref 32–48)
pCO2 arterial: 33.9 mmHg (ref 32–48)
pCO2 arterial: 28.3 mmHg — ABNORMAL LOW (ref 32–48)
pCO2 arterial: 46.1 mmHg (ref 32–48)
pH, Arterial: 7.44 (ref 7.35–7.45)
pH, Arterial: 7.453 — ABNORMAL HIGH (ref 7.35–7.45)
pH, Arterial: 7.462 — ABNORMAL HIGH (ref 7.35–7.45)
pH, Arterial: 7.408 (ref 7.35–7.45)
pH, Arterial: 7.327 — ABNORMAL LOW (ref 7.35–7.45)
pO2, Arterial: 332 mmHg — ABNORMAL HIGH (ref 83–108)
pO2, Arterial: 369 mmHg — ABNORMAL HIGH (ref 83–108)
pO2, Arterial: 294 mmHg — ABNORMAL HIGH (ref 83–108)
pO2, Arterial: 143 mmHg — ABNORMAL HIGH (ref 83–108)
pO2, Arterial: 107 mmHg (ref 83–108)

## 2024-04-24 LAB — POCT I-STAT, CHEM 8
BUN: 16 mg/dL (ref 6–20)
BUN: 14 mg/dL (ref 6–20)
BUN: 13 mg/dL (ref 6–20)
BUN: 14 mg/dL (ref 6–20)
Calcium, Ion: 1.2 mmol/L (ref 1.15–1.40)
Calcium, Ion: 1.21 mmol/L (ref 1.15–1.40)
Calcium, Ion: 1.05 mmol/L — ABNORMAL LOW (ref 1.15–1.40)
Calcium, Ion: 1.06 mmol/L — ABNORMAL LOW (ref 1.15–1.40)
Chloride: 101 mmol/L (ref 98–111)
Chloride: 102 mmol/L (ref 98–111)
Chloride: 104 mmol/L (ref 98–111)
Chloride: 102 mmol/L (ref 98–111)
Creatinine, Ser: 0.9 mg/dL (ref 0.61–1.24)
Creatinine, Ser: 0.8 mg/dL (ref 0.61–1.24)
Creatinine, Ser: 0.8 mg/dL (ref 0.61–1.24)
Creatinine, Ser: 0.7 mg/dL (ref 0.61–1.24)
Glucose, Bld: 135 mg/dL — ABNORMAL HIGH (ref 70–99)
Glucose, Bld: 114 mg/dL — ABNORMAL HIGH (ref 70–99)
Glucose, Bld: 121 mg/dL — ABNORMAL HIGH (ref 70–99)
Glucose, Bld: 132 mg/dL — ABNORMAL HIGH (ref 70–99)
HCT: 37 % — ABNORMAL LOW (ref 39.0–52.0)
HCT: 35 % — ABNORMAL LOW (ref 39.0–52.0)
HCT: 28 % — ABNORMAL LOW (ref 39.0–52.0)
HCT: 30 % — ABNORMAL LOW (ref 39.0–52.0)
Hemoglobin: 12.6 g/dL — ABNORMAL LOW (ref 13.0–17.0)
Hemoglobin: 11.9 g/dL — ABNORMAL LOW (ref 13.0–17.0)
Hemoglobin: 9.5 g/dL — ABNORMAL LOW (ref 13.0–17.0)
Hemoglobin: 10.2 g/dL — ABNORMAL LOW (ref 13.0–17.0)
Potassium: 2.9 mmol/L — ABNORMAL LOW (ref 3.5–5.1)
Potassium: 3.3 mmol/L — ABNORMAL LOW (ref 3.5–5.1)
Potassium: 3.7 mmol/L (ref 3.5–5.1)
Potassium: 3.8 mmol/L (ref 3.5–5.1)
Sodium: 138 mmol/L (ref 135–145)
Sodium: 139 mmol/L (ref 135–145)
Sodium: 139 mmol/L (ref 135–145)
Sodium: 137 mmol/L (ref 135–145)
TCO2: 25 mmol/L (ref 22–32)
TCO2: 28 mmol/L (ref 22–32)
TCO2: 27 mmol/L (ref 22–32)
TCO2: 26 mmol/L (ref 22–32)

## 2024-04-24 LAB — CBC WITH DIFFERENTIAL/PLATELET
Abs Immature Granulocytes: 0.04 10*3/uL (ref 0.00–0.07)
Basophils Absolute: 0 10*3/uL (ref 0.0–0.1)
Basophils Relative: 0 %
Eosinophils Absolute: 0 10*3/uL (ref 0.0–0.5)
Eosinophils Relative: 0 %
HCT: 27.7 % — ABNORMAL LOW (ref 39.0–52.0)
Hemoglobin: 9.6 g/dL — ABNORMAL LOW (ref 13.0–17.0)
Immature Granulocytes: 0 %
Lymphocytes Relative: 6 %
Lymphs Abs: 0.7 10*3/uL (ref 0.7–4.0)
MCH: 33.6 pg (ref 26.0–34.0)
MCHC: 34.7 g/dL (ref 30.0–36.0)
MCV: 96.9 fL (ref 80.0–100.0)
Monocytes Absolute: 0.9 10*3/uL (ref 0.1–1.0)
Monocytes Relative: 9 %
Neutro Abs: 8.8 10*3/uL — ABNORMAL HIGH (ref 1.7–7.7)
Neutrophils Relative %: 85 %
Platelets: 180 10*3/uL (ref 150–400)
RBC: 2.86 MIL/uL — ABNORMAL LOW (ref 4.22–5.81)
RDW: 11.5 % (ref 11.5–15.5)
WBC: 10.5 10*3/uL (ref 4.0–10.5)
nRBC: 0 % (ref 0.0–0.2)

## 2024-04-24 LAB — PROTIME-INR
INR: 1.4 — ABNORMAL HIGH (ref 0.8–1.2)
Prothrombin Time: 17 s — ABNORMAL HIGH (ref 11.4–15.2)

## 2024-04-24 LAB — POCT I-STAT EG7
Acid-Base Excess: 1 mmol/L (ref 0.0–2.0)
Bicarbonate: 25.3 mmol/L (ref 20.0–28.0)
Calcium, Ion: 1.05 mmol/L — ABNORMAL LOW (ref 1.15–1.40)
HCT: 28 % — ABNORMAL LOW (ref 39.0–52.0)
Hemoglobin: 9.5 g/dL — ABNORMAL LOW (ref 13.0–17.0)
O2 Saturation: 89 %
Potassium: 4 mmol/L (ref 3.5–5.1)
Sodium: 138 mmol/L (ref 135–145)
TCO2: 26 mmol/L (ref 22–32)
pCO2, Ven: 37.4 mmHg — ABNORMAL LOW (ref 44–60)
pH, Ven: 7.438 — ABNORMAL HIGH (ref 7.25–7.43)
pO2, Ven: 54 mmHg — ABNORMAL HIGH (ref 32–45)

## 2024-04-24 LAB — CBC
HCT: 30.8 % — ABNORMAL LOW (ref 39.0–52.0)
Hemoglobin: 10.9 g/dL — ABNORMAL LOW (ref 13.0–17.0)
MCH: 33.9 pg (ref 26.0–34.0)
MCHC: 35.4 g/dL (ref 30.0–36.0)
MCV: 95.7 fL (ref 80.0–100.0)
Platelets: 171 10*3/uL (ref 150–400)
RBC: 3.22 MIL/uL — ABNORMAL LOW (ref 4.22–5.81)
RDW: 11.4 % — ABNORMAL LOW (ref 11.5–15.5)
WBC: 12.3 10*3/uL — ABNORMAL HIGH (ref 4.0–10.5)
nRBC: 0 % (ref 0.0–0.2)

## 2024-04-24 LAB — ABO/RH: ABO/RH(D): O POS

## 2024-04-24 LAB — HEMOGLOBIN AND HEMATOCRIT, BLOOD
HCT: 27.8 % — ABNORMAL LOW (ref 39.0–52.0)
Hemoglobin: 9.7 g/dL — ABNORMAL LOW (ref 13.0–17.0)

## 2024-04-24 LAB — GLUCOSE, CAPILLARY
Glucose-Capillary: 100 mg/dL — ABNORMAL HIGH (ref 70–99)
Glucose-Capillary: 144 mg/dL — ABNORMAL HIGH (ref 70–99)
Glucose-Capillary: 151 mg/dL — ABNORMAL HIGH (ref 70–99)
Glucose-Capillary: 115 mg/dL — ABNORMAL HIGH (ref 70–99)
Glucose-Capillary: 122 mg/dL — ABNORMAL HIGH (ref 70–99)
Glucose-Capillary: 128 mg/dL — ABNORMAL HIGH (ref 70–99)

## 2024-04-24 LAB — APTT: aPTT: 31 s (ref 24–36)

## 2024-04-24 LAB — PLATELET COUNT: Platelets: 196 10*3/uL (ref 150–400)

## 2024-04-24 SURGERY — CORONARY ARTERY BYPASS GRAFTING (CABG)
Anesthesia: General | Site: Chest

## 2024-04-24 MED ORDER — HEMOSTATIC AGENTS (NO CHARGE) OPTIME
TOPICAL | Status: DC | PRN
  Administered 2024-04-24: 1 via TOPICAL

## 2024-04-24 MED ORDER — TRAMADOL HCL 50 MG PO TABS
50.0000 mg | ORAL_TABLET | ORAL | Status: DC | PRN
  Administered 2024-04-24: 50 mg via ORAL
  Administered 2024-04-25 – 2024-04-26 (×4): 100 mg via ORAL
  Filled 2024-04-24 (×2): qty 2
  Filled 2024-04-24: qty 1
  Filled 2024-04-24 (×2): qty 2

## 2024-04-24 MED ORDER — MAGNESIUM SULFATE 4 GM/100ML IV SOLN
4.0000 g | Freq: Once | INTRAVENOUS | Status: AC
  Administered 2024-04-24: 4 g via INTRAVENOUS
  Filled 2024-04-24: qty 100

## 2024-04-24 MED ORDER — LACTATED RINGERS IV SOLN: INTRAVENOUS | Status: DC | PRN

## 2024-04-24 MED ORDER — ACETAMINOPHEN 500 MG PO TABS
1000.0000 mg | ORAL_TABLET | Freq: Four times a day (QID) | ORAL | Status: DC
  Administered 2024-04-24 – 2024-04-28 (×15): 1000 mg via ORAL
  Filled 2024-04-24 (×15): qty 2

## 2024-04-24 MED ORDER — SODIUM CHLORIDE 0.9% FLUSH
3.0000 mL | Freq: Two times a day (BID) | INTRAVENOUS | Status: DC
  Administered 2024-04-24 – 2024-04-25 (×2): 10 mL via INTRAVENOUS

## 2024-04-24 MED ORDER — SODIUM CHLORIDE 0.9% FLUSH
3.0000 mL | Freq: Two times a day (BID) | INTRAVENOUS | Status: DC
  Administered 2024-04-25: 3 mL via INTRAVENOUS

## 2024-04-24 MED ORDER — ASPIRIN 325 MG PO TBEC
325.0000 mg | DELAYED_RELEASE_TABLET | Freq: Every day | ORAL | Status: DC
  Administered 2024-04-25 – 2024-04-28 (×4): 325 mg via ORAL
  Filled 2024-04-24 (×4): qty 1

## 2024-04-24 MED ORDER — ROCURONIUM BROMIDE 10 MG/ML (PF) SYRINGE
PREFILLED_SYRINGE | INTRAVENOUS | Status: DC | PRN
  Administered 2024-04-24: 30 mg via INTRAVENOUS
  Administered 2024-04-24: 40 mg via INTRAVENOUS
  Administered 2024-04-24: 90 mg via INTRAVENOUS

## 2024-04-24 MED ORDER — ORAL CARE MOUTH RINSE: 15.0000 mL | OROMUCOSAL | Status: DC | PRN

## 2024-04-24 MED ORDER — ASPIRIN 81 MG PO CHEW
324.0000 mg | CHEWABLE_TABLET | Freq: Once | ORAL | Status: DC
  Filled 2024-04-24: qty 4

## 2024-04-24 MED ORDER — DEXMEDETOMIDINE HCL IN NACL 400 MCG/100ML IV SOLN: 0.0000 ug/kg/h | INTRAVENOUS | Status: DC

## 2024-04-24 MED ORDER — METOPROLOL TARTRATE 5 MG/5ML IV SOLN: 2.5000 mg | INTRAVENOUS | Status: DC | PRN

## 2024-04-24 MED ORDER — SODIUM CHLORIDE 0.9 % IV SOLN: INTRAVENOUS | Status: AC

## 2024-04-24 MED ORDER — BISACODYL 10 MG RE SUPP: 10.0000 mg | Freq: Every day | RECTAL | Status: DC

## 2024-04-24 MED ORDER — SODIUM CHLORIDE 0.9% FLUSH
3.0000 mL | Freq: Two times a day (BID) | INTRAVENOUS | Status: DC
  Administered 2024-04-24 – 2024-04-25 (×3): 10 mL via INTRAVENOUS

## 2024-04-24 MED ORDER — PLASMA-LYTE A IV SOLN: INTRAVENOUS | Status: DC | PRN

## 2024-04-24 MED ORDER — ALBUMIN HUMAN 5 % IV SOLN: INTRAVENOUS | Status: DC | PRN

## 2024-04-24 MED ORDER — PANTOPRAZOLE SODIUM 40 MG PO TBEC
40.0000 mg | DELAYED_RELEASE_TABLET | Freq: Every day | ORAL | Status: DC
  Administered 2024-04-26 – 2024-04-28 (×3): 40 mg via ORAL
  Filled 2024-04-24 (×3): qty 1

## 2024-04-24 MED ORDER — FENTANYL CITRATE (PF) 250 MCG/5ML IJ SOLN
INTRAMUSCULAR | Status: AC
  Filled 2024-04-24: qty 5

## 2024-04-24 MED ORDER — CHLORHEXIDINE GLUCONATE 4 % EX SOLN: 30.0000 mL | CUTANEOUS | Status: DC

## 2024-04-24 MED ORDER — FENTANYL CITRATE (PF) 100 MCG/2ML IJ SOLN
INTRAMUSCULAR | Status: DC | PRN
  Administered 2024-04-24: 200 ug via INTRAVENOUS
  Administered 2024-04-24 (×3): 100 ug via INTRAVENOUS
  Administered 2024-04-24: 150 ug via INTRAVENOUS
  Administered 2024-04-24: 50 ug via INTRAVENOUS
  Administered 2024-04-24 (×2): 100 ug via INTRAVENOUS
  Administered 2024-04-24: 150 ug via INTRAVENOUS

## 2024-04-24 MED ORDER — ALBUMIN HUMAN 5 % IV SOLN
250.0000 mL | INTRAVENOUS | Status: DC | PRN
  Administered 2024-04-24 (×4): 12.5 g via INTRAVENOUS
  Filled 2024-04-24: qty 250

## 2024-04-24 MED ORDER — POTASSIUM CHLORIDE 10 MEQ/50ML IV SOLN
10.0000 meq | INTRAVENOUS | Status: AC
  Administered 2024-04-24 (×3): 10 meq via INTRAVENOUS

## 2024-04-24 MED ORDER — MIDAZOLAM HCL 5 MG/5ML IJ SOLN
INTRAMUSCULAR | Status: DC | PRN
  Administered 2024-04-24 (×2): 2 mg via INTRAVENOUS

## 2024-04-24 MED ORDER — METOPROLOL TARTRATE 25 MG/10 ML ORAL SUSPENSION
12.5000 mg | Freq: Two times a day (BID) | ORAL | Status: DC
  Filled 2024-04-24 (×5): qty 5

## 2024-04-24 MED ORDER — ACETAMINOPHEN 160 MG/5ML PO SOLN
650.0000 mg | Freq: Once | ORAL | Status: DC
  Filled 2024-04-24: qty 20.3

## 2024-04-24 MED ORDER — GELATIN ABSORBABLE MT POWD: OROMUCOSAL | Status: DC | PRN

## 2024-04-24 MED ORDER — ONDANSETRON HCL 4 MG/2ML IJ SOLN
4.0000 mg | Freq: Four times a day (QID) | INTRAMUSCULAR | Status: DC | PRN
  Administered 2024-04-24 – 2024-04-27 (×3): 4 mg via INTRAVENOUS
  Filled 2024-04-24 (×3): qty 2

## 2024-04-24 MED ORDER — ASPIRIN 81 MG PO CHEW: 324.0000 mg | CHEWABLE_TABLET | Freq: Every day | ORAL | Status: DC

## 2024-04-24 MED ORDER — ACETAMINOPHEN 160 MG/5ML PO SOLN: 1000.0000 mg | Freq: Four times a day (QID) | ORAL | Status: DC

## 2024-04-24 MED ORDER — SODIUM BICARBONATE 8.4 % IV SOLN
50.0000 meq | Freq: Once | INTRAVENOUS | Status: AC
  Administered 2024-04-24: 50 meq via INTRAVENOUS

## 2024-04-24 MED ORDER — DOCUSATE SODIUM 100 MG PO CAPS
200.0000 mg | ORAL_CAPSULE | Freq: Every day | ORAL | Status: DC
  Administered 2024-04-25 – 2024-04-28 (×4): 200 mg via ORAL
  Filled 2024-04-24 (×4): qty 2

## 2024-04-24 MED ORDER — VANCOMYCIN HCL IN DEXTROSE 1-5 GM/200ML-% IV SOLN
1000.0000 mg | Freq: Once | INTRAVENOUS | Status: AC
  Administered 2024-04-24: 1000 mg via INTRAVENOUS
  Filled 2024-04-24: qty 200

## 2024-04-24 MED ORDER — DEXTROSE 50 % IV SOLN: 0.0000 mL | INTRAVENOUS | Status: DC | PRN

## 2024-04-24 MED ORDER — CHLORHEXIDINE GLUCONATE 0.12 % MT SOLN
15.0000 mL | OROMUCOSAL | Status: AC
  Administered 2024-04-24: 15 mL via OROMUCOSAL
  Filled 2024-04-24: qty 15

## 2024-04-24 MED ORDER — METOPROLOL TARTRATE 12.5 MG HALF TABLET
12.5000 mg | ORAL_TABLET | Freq: Two times a day (BID) | ORAL | Status: DC
  Administered 2024-04-25 – 2024-04-28 (×6): 12.5 mg via ORAL
  Filled 2024-04-24 (×6): qty 1

## 2024-04-24 MED ORDER — LACTATED RINGERS IV SOLN: INTRAVENOUS | Status: DC

## 2024-04-24 MED ORDER — SODIUM CHLORIDE 0.9% FLUSH: 3.0000 mL | INTRAVENOUS | Status: DC | PRN

## 2024-04-24 MED ORDER — ORAL CARE MOUTH RINSE: 15.0000 mL | OROMUCOSAL | Status: DC

## 2024-04-24 MED ORDER — PROTAMINE SULFATE 10 MG/ML IV SOLN
INTRAVENOUS | Status: AC
Start: 2024-04-24 — End: ?
  Filled 2024-04-24: qty 25

## 2024-04-24 MED ORDER — BISACODYL 5 MG PO TBEC
10.0000 mg | DELAYED_RELEASE_TABLET | Freq: Every day | ORAL | Status: DC
  Administered 2024-04-25 – 2024-04-28 (×3): 10 mg via ORAL
  Filled 2024-04-24 (×3): qty 2

## 2024-04-24 MED ORDER — CHLORHEXIDINE GLUCONATE 0.12 % MT SOLN
15.0000 mL | Freq: Once | OROMUCOSAL | Status: AC
  Administered 2024-04-24: 15 mL via OROMUCOSAL

## 2024-04-24 MED ORDER — ~~LOC~~ CARDIAC SURGERY, PATIENT & FAMILY EDUCATION
Freq: Once | Status: DC
  Filled 2024-04-24 (×2): qty 1

## 2024-04-24 MED ORDER — CHLORHEXIDINE GLUCONATE 0.12 % MT SOLN
15.0000 mL | Freq: Once | OROMUCOSAL | Status: AC
  Administered 2024-04-24: 15 mL via OROMUCOSAL
  Filled 2024-04-24: qty 15

## 2024-04-24 MED ORDER — PROTAMINE SULFATE 10 MG/ML IV SOLN
INTRAVENOUS | Status: DC | PRN
  Administered 2024-04-24: 230 mg via INTRAVENOUS
  Administered 2024-04-24: 20 mg via INTRAVENOUS

## 2024-04-24 MED ORDER — HEPARIN SODIUM (PORCINE) 1000 UNIT/ML IJ SOLN
INTRAMUSCULAR | Status: AC
  Filled 2024-04-24: qty 1

## 2024-04-24 MED ORDER — INSULIN REGULAR(HUMAN) IN NACL 100-0.9 UT/100ML-% IV SOLN
INTRAVENOUS | Status: DC
  Administered 2024-04-24: 1.2 [IU]/h via INTRAVENOUS

## 2024-04-24 MED ORDER — 0.9 % SODIUM CHLORIDE (POUR BTL) OPTIME
TOPICAL | Status: DC | PRN
  Administered 2024-04-24: 5000 mL

## 2024-04-24 MED ORDER — MIDAZOLAM HCL 2 MG/2ML IJ SOLN
2.0000 mg | INTRAMUSCULAR | Status: DC | PRN
Start: 2024-04-24 — End: 2024-04-24
  Filled 2024-04-24: qty 2

## 2024-04-24 MED ORDER — CHLORHEXIDINE GLUCONATE CLOTH 2 % EX PADS
6.0000 | MEDICATED_PAD | Freq: Every day | CUTANEOUS | Status: DC
  Administered 2024-04-24 – 2024-04-26 (×3): 6 via TOPICAL

## 2024-04-24 MED ORDER — FENTANYL CITRATE (PF) 250 MCG/5ML IJ SOLN
INTRAMUSCULAR | Status: AC
Start: 2024-04-24 — End: ?
  Filled 2024-04-24: qty 5

## 2024-04-24 MED ORDER — SUCCINYLCHOLINE CHLORIDE 200 MG/10ML IV SOSY
PREFILLED_SYRINGE | INTRAVENOUS | Status: AC
  Filled 2024-04-24: qty 10

## 2024-04-24 MED ORDER — PROPOFOL 10 MG/ML IV BOLUS
INTRAVENOUS | Status: AC
  Filled 2024-04-24: qty 20

## 2024-04-24 MED ORDER — PROPOFOL 10 MG/ML IV BOLUS
INTRAVENOUS | Status: DC | PRN
  Administered 2024-04-24 (×3): 50 mg via INTRAVENOUS

## 2024-04-24 MED ORDER — MORPHINE SULFATE (PF) 2 MG/ML IV SOLN: 1.0000 mg | INTRAVENOUS | Status: DC | PRN

## 2024-04-24 MED ORDER — NICARDIPINE HCL IN NACL 20-0.86 MG/200ML-% IV SOLN
0.0000 mg/h | INTRAVENOUS | Status: DC
  Administered 2024-04-24: 5 mg/h via INTRAVENOUS
  Administered 2024-04-25: 1 mg/h via INTRAVENOUS
  Filled 2024-04-24: qty 200

## 2024-04-24 MED ORDER — METOPROLOL TARTRATE 12.5 MG HALF TABLET: 12.5000 mg | ORAL_TABLET | Freq: Once | ORAL | Status: DC

## 2024-04-24 MED ORDER — NICARDIPINE HCL IN NACL 20-0.86 MG/200ML-% IV SOLN
INTRAVENOUS | Status: DC | PRN
  Administered 2024-04-24: 2.5 mg/h via INTRAVENOUS

## 2024-04-24 MED ORDER — CEFAZOLIN SODIUM-DEXTROSE 2-4 GM/100ML-% IV SOLN
2.0000 g | Freq: Three times a day (TID) | INTRAVENOUS | Status: AC
Start: 2024-04-24 — End: 2024-04-26
  Administered 2024-04-24 – 2024-04-26 (×6): 2 g via INTRAVENOUS
  Filled 2024-04-24 (×6): qty 100

## 2024-04-24 MED ORDER — ROCURONIUM BROMIDE 10 MG/ML (PF) SYRINGE
PREFILLED_SYRINGE | INTRAVENOUS | Status: AC
  Filled 2024-04-24: qty 10

## 2024-04-24 MED ORDER — MIDAZOLAM HCL (PF) 10 MG/2ML IJ SOLN
INTRAMUSCULAR | Status: AC
  Filled 2024-04-24: qty 2

## 2024-04-24 MED ORDER — ASPIRIN 325 MG PO TABS
325.0000 mg | ORAL_TABLET | Freq: Once | ORAL | Status: AC
  Administered 2024-04-24: 325 mg via ORAL

## 2024-04-24 MED ORDER — METOCLOPRAMIDE HCL 5 MG/ML IJ SOLN
10.0000 mg | Freq: Four times a day (QID) | INTRAMUSCULAR | Status: AC
  Administered 2024-04-24 – 2024-04-26 (×6): 10 mg via INTRAVENOUS
  Filled 2024-04-24 (×6): qty 2

## 2024-04-24 MED ORDER — ACETAMINOPHEN 160 MG/5ML PO SOLN: 650.0000 mg | Freq: Once | ORAL | Status: DC

## 2024-04-24 MED ORDER — CHLORHEXIDINE GLUCONATE 0.12 % MT SOLN: 15.0000 mL | Freq: Once | OROMUCOSAL | Status: DC

## 2024-04-24 MED ORDER — LACTATED RINGERS IV SOLN: INTRAVENOUS | Status: AC

## 2024-04-24 MED ORDER — PANTOPRAZOLE SODIUM 40 MG IV SOLR
40.0000 mg | Freq: Every day | INTRAVENOUS | Status: AC
Start: 2024-04-24 — End: 2024-04-26
  Administered 2024-04-24 – 2024-04-25 (×2): 40 mg via INTRAVENOUS
  Filled 2024-04-24 (×2): qty 10

## 2024-04-24 MED ORDER — ACETAMINOPHEN 325 MG PO TABS
ORAL_TABLET | ORAL | Status: AC
  Filled 2024-04-24: qty 2

## 2024-04-24 MED ORDER — NOREPINEPHRINE 4 MG/250ML-% IV SOLN: 0.0000 ug/min | INTRAVENOUS | Status: DC

## 2024-04-24 MED ORDER — EPHEDRINE 5 MG/ML INJ
INTRAVENOUS | Status: AC
  Filled 2024-04-24: qty 5

## 2024-04-24 MED ORDER — ASPIRIN 325 MG PO TABS
ORAL_TABLET | ORAL | Status: AC
  Filled 2024-04-24: qty 1

## 2024-04-24 MED ORDER — HEPARIN SODIUM (PORCINE) 1000 UNIT/ML IJ SOLN
INTRAMUSCULAR | Status: DC | PRN
  Administered 2024-04-24: 25000 [IU] via INTRAVENOUS

## 2024-04-24 MED ORDER — PHENYLEPHRINE HCL-NACL 20-0.9 MG/250ML-% IV SOLN: 0.0000 ug/min | INTRAVENOUS | Status: DC

## 2024-04-24 MED ORDER — OXYCODONE HCL 5 MG PO TABS
5.0000 mg | ORAL_TABLET | ORAL | Status: DC | PRN
  Administered 2024-04-24 – 2024-04-26 (×5): 10 mg via ORAL
  Filled 2024-04-24 (×5): qty 2
  Filled 2024-04-24: qty 1

## 2024-04-24 MED ORDER — ORAL CARE MOUTH RINSE: 15.0000 mL | Freq: Once | OROMUCOSAL | Status: AC

## 2024-04-24 MED ORDER — PHENYLEPHRINE 80 MCG/ML (10ML) SYRINGE FOR IV PUSH (FOR BLOOD PRESSURE SUPPORT)
PREFILLED_SYRINGE | INTRAVENOUS | Status: AC
  Filled 2024-04-24: qty 10

## 2024-04-24 MED ORDER — ACETAMINOPHEN 325 MG PO TABS
650.0000 mg | ORAL_TABLET | Freq: Once | ORAL | Status: AC
  Administered 2024-04-24: 650 mg via ORAL

## 2024-04-24 SURGICAL SUPPLY — 90 items
BAG DECANTER FOR FLEXI CONT (MISCELLANEOUS) ×3 IMPLANT
BLADE CLIPPER SURG (BLADE) ×6 IMPLANT
BLADE STERNUM SYSTEM 6 (BLADE) ×3 IMPLANT
BLADE SURG 11 STRL SS (BLADE) IMPLANT
BLADE SURG 15 STRL LF DISP TIS (BLADE) ×3 IMPLANT
BNDG ELASTIC 4INX 5YD STR LF (GAUZE/BANDAGES/DRESSINGS) IMPLANT
BNDG ELASTIC 4X5.8 VLCR STR LF (GAUZE/BANDAGES/DRESSINGS) ×3 IMPLANT
BNDG ELASTIC 6INX 5YD STR LF (GAUZE/BANDAGES/DRESSINGS) ×3 IMPLANT
BNDG GAUZE DERMACEA FLUFF 4 (GAUZE/BANDAGES/DRESSINGS) ×3 IMPLANT
CANISTER SUCTION 3000ML PPV (SUCTIONS) ×3 IMPLANT
CANNULA MC2 2 STG 29/37 NON-V (CANNULA) ×3 IMPLANT
CANNULA NON VENT 20FR 12 (CANNULA) ×3 IMPLANT
CANNULA TRIPLE STAGE 29X29X29 (MISCELLANEOUS) IMPLANT
CANNULA VESSEL 3MM BLUNT TIP (CANNULA) IMPLANT
CATH ROBINSON RED A/P 18FR (CATHETERS) ×6 IMPLANT
CLIP APPLIE 9.375 SM OPEN (CLIP) ×3 IMPLANT
CLIP RETRACTION 3.0MM CORONARY (MISCELLANEOUS) IMPLANT
CLIP TI MEDIUM 24 (CLIP) IMPLANT
CLIP TI WIDE RED SMALL 24 (CLIP) IMPLANT
CONN ST 3/8 X 1/2 (MISCELLANEOUS) IMPLANT
CONNECTOR BLAKE 2:1 CARIO BLK (MISCELLANEOUS) ×3 IMPLANT
CONTAINER PROTECT SURGISLUSH (MISCELLANEOUS) ×6 IMPLANT
COVER MAYO STAND STRL (DRAPES) ×3 IMPLANT
CUFF TOURN SGL QUICK 18X4 (TOURNIQUET CUFF) IMPLANT
CUFF TRNQT CYL 24X4X16.5-23 (TOURNIQUET CUFF) IMPLANT
DRAIN CHANNEL 19F RND (DRAIN) ×9 IMPLANT
DRAIN CONNECTOR BLAKE 1:1 (MISCELLANEOUS) IMPLANT
DRAPE EXTREMITY T 121X128X90 (DISPOSABLE) ×3 IMPLANT
DRAPE HALF SHEET 40X57 (DRAPES) ×3 IMPLANT
DRAPE INCISE IOBAN 66X45 STRL (DRAPES) IMPLANT
DRAPE SRG 135X102X78XABS (DRAPES) ×3 IMPLANT
DRAPE WARM FLUID 44X44 (DRAPES) ×3 IMPLANT
DRSG AQUACEL AG ADV 3.5X10 (GAUZE/BANDAGES/DRESSINGS) ×3 IMPLANT
DRSG AQUACEL AG ADV 3.5X14 (GAUZE/BANDAGES/DRESSINGS) ×3 IMPLANT
ELECTRODE BLDE 4.0 EZ CLN MEGD (MISCELLANEOUS) ×3 IMPLANT
ELECTRODE REM PT RTRN 9FT ADLT (ELECTROSURGICAL) ×6 IMPLANT
FELT TEFLON 1X6 (MISCELLANEOUS) ×6 IMPLANT
GAUZE 4X4 16PLY ~~LOC~~+RFID DBL (SPONGE) ×3 IMPLANT
GAUZE SPONGE 4X4 12PLY STRL (GAUZE/BANDAGES/DRESSINGS) ×6 IMPLANT
GEL ULTRASOUND 20GR AQUASONIC (MISCELLANEOUS) ×3 IMPLANT
GLOVE BIO SURGEON STRL SZ7 (GLOVE) ×6 IMPLANT
GLOVE BIOGEL M STRL SZ7.5 (GLOVE) ×6 IMPLANT
GOWN STRL REUS W/ TWL LRG LVL3 (GOWN DISPOSABLE) ×12 IMPLANT
GOWN STRL REUS W/ TWL XL LVL3 (GOWN DISPOSABLE) ×6 IMPLANT
HEMOSTAT POWDER SURGIFOAM 1G (HEMOSTASIS) ×6 IMPLANT
HEMOSTAT SURGICEL 2X14 (HEMOSTASIS) IMPLANT
INSERT SUTURE HOLDER (MISCELLANEOUS) ×3 IMPLANT
KIT BASIN OR (CUSTOM PROCEDURE TRAY) ×3 IMPLANT
KIT TURNOVER KIT B (KITS) ×6 IMPLANT
KIT VASOVIEW HEMOPRO 2 VH 4000 (KITS) ×3 IMPLANT
LEAD PACING MYOCARDI (MISCELLANEOUS) ×3 IMPLANT
MARKER DISTAL GRAFT W/ HOLDER (MISCELLANEOUS) ×9 IMPLANT
NS IRRIG 1000ML POUR BTL (IV SOLUTION) ×15 IMPLANT
PACK E OPEN HEART (SUTURE) ×3 IMPLANT
PACK OPEN HEART (CUSTOM PROCEDURE TRAY) ×3 IMPLANT
PAD ARMBOARD POSITIONER FOAM (MISCELLANEOUS) ×12 IMPLANT
PAD ELECT DEFIB RADIOL ZOLL (MISCELLANEOUS) ×3 IMPLANT
PENCIL BUTTON HOLSTER BLD 10FT (ELECTRODE) ×3 IMPLANT
POSITIONER HEAD DONUT 9IN (MISCELLANEOUS) ×3 IMPLANT
PUNCH AORTIC ROTATE 4.0MM (MISCELLANEOUS) ×3 IMPLANT
SET MPS 3-ND DEL (MISCELLANEOUS) IMPLANT
SHEARS HARMONIC 9CM CVD (BLADE) ×3 IMPLANT
SOLUTION ANTFG W/FOAM PAD STRL (MISCELLANEOUS) IMPLANT
SPONGE T-LAP 18X18 ~~LOC~~+RFID (SPONGE) ×12 IMPLANT
STOPCOCK 4 WAY LG BORE MALE ST (IV SETS) IMPLANT
SUPPORT HEART JANKE-BARRON (MISCELLANEOUS) ×3 IMPLANT
SUT ETHIBOND X763 2 0 SH 1 (SUTURE) ×6 IMPLANT
SUT MNCRL AB 3-0 PS2 18 (SUTURE) ×6 IMPLANT
SUT MNCRL AB 4-0 PS2 18 (SUTURE) IMPLANT
SUT PDS AB 1 CTX 36 (SUTURE) ×6 IMPLANT
SUT PROLENE 3 0 SH DA (SUTURE) IMPLANT
SUT PROLENE 4 0 SH DA (SUTURE) ×3 IMPLANT
SUT PROLENE 4-0 RB1 .5 CRCL 36 (SUTURE) IMPLANT
SUT PROLENE 5 0 C 1 36 (SUTURE) ×9 IMPLANT
SUT PROLENE 7 0 BV 1 (SUTURE) IMPLANT
SUT PROLENE 7 0 BV1 MDA (SUTURE) ×3 IMPLANT
SUT STEEL 6MS V (SUTURE) ×6 IMPLANT
SUT VIC AB 2-0 CT1 TAPERPNT 27 (SUTURE) IMPLANT
SUT VIC AB 3-0 SH 27X BRD (SUTURE) IMPLANT
SUT VIC AB 3-0 X1 27 (SUTURE) IMPLANT
SYR 50ML SLIP (SYRINGE) IMPLANT
SYSTEM SAHARA CHEST DRAIN ATS (WOUND CARE) ×3 IMPLANT
TAPE CLOTH SURG 4X10 WHT LF (GAUZE/BANDAGES/DRESSINGS) IMPLANT
TAPE PAPER 2X10 WHT MICROPORE (GAUZE/BANDAGES/DRESSINGS) IMPLANT
TOWEL GREEN STERILE (TOWEL DISPOSABLE) ×6 IMPLANT
TOWEL GREEN STERILE FF (TOWEL DISPOSABLE) ×6 IMPLANT
TRAY FOLEY SLVR 16FR TEMP STAT (SET/KITS/TRAYS/PACK) ×3 IMPLANT
TUBING LAP HI FLOW INSUFFLATIO (TUBING) ×3 IMPLANT
UNDERPAD 30X36 HEAVY ABSORB (UNDERPADS AND DIAPERS) ×6 IMPLANT
WATER STERILE IRR 1000ML POUR (IV SOLUTION) ×6 IMPLANT

## 2024-04-24 NOTE — Brief Op Note (Signed)
 04/24/2024  2:46 PM  PATIENT:  Gary Castaneda  53 y.o. male  PRE-OPERATIVE DIAGNOSIS:  CORONARY ARTERY DISEASE  POST-OPERATIVE DIAGNOSIS:  CORONARY ARTERY DISEASE  PROCEDURE:  CORONARY ARTERY BYPASS GRAFTING (CABG) TIMES FOUR UTILIZING LEFT INTERNAL MAMMARY ARTERY, LEFT RADIAL ARTERY, AND RIGHT ENDOSCOPIC VEIN HARVEST OF GREAT SAPENOUS VEIN.,  SURGICAL PROCUREMENT, ARTERY, RADIAL (Left)  LIMA-LAD LRA-RI SVG-OM SVG-PDA  Vein harvest time: Vein prep time:  ECHOCARDIOGRAM, TRANSESOPHAGEAL, INTRAOPERATIVE   SURGEON: Lightfoot, Marinell Siad, MD - Primary  PHYSICIAN ASSISTANTS: Ronie Fleeger / Barrett  ASSISTANTS: Levorn Reason, RN, RN First Assistant    ANESTHESIA:   general  EBL:   BLOOD ADMINISTERED:none  DRAINS: Mediastinal and left pleural drains  LOCAL MEDICATIONS USED:  NONE  COUNTS:  Correct  DICTATION: .Dragon Dictation  PLAN OF CARE: Admit to inpatient   PATIENT DISPOSITION:  ICU - intubated and hemodynamically stable.   Delay start of Pharmacological VTE agent (>24hrs) due to surgical blood loss or risk of bleeding: yes

## 2024-04-24 NOTE — Hospital Course (Addendum)
 Referring: Arleen Lacer, MD Primary Care: Jearlean Mince, PA-C Primary Cardiologist:Mark Renna Cary, MD  History of Present Illness:     Gary Castaneda is a 53 y.o. 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  He has a strong family history of early cardiac disease, and has been followed closely since his 75s.  He underwent a coronary CT, which was then followed by a LHC recently.   Overall, he denies any anginal symptoms.  He exercises on the elliptical for 45 minutes at a time without problems.  His biggest complaint is the fact that his legs occasionally feel heavy.  He does endorse a long history of vasovagal episodes, and recently had one during his work-up after looking at blood.  At the time his blood pressure dropped to the 70s.  He also passed out recently in the shower.  Although he denies any anginal symptoms, I think that his vagal episodes are concerning for coronary obstruction given his degree of obstructive disease. I have recommended that he undergo CABG with use of the left radial artery. He is agreeable to proceed.   Hospital Course: Gary Castaneda was admitted for elective surgery on 04/24/2024.  He was taken to the OR where CABG x 4 was accomplished.  The left internal mammary artery was grafted to the left anterior descending coronary artery.  The left radial graft was placed to the ramus intermediate coronary artery.  Separate saphenous vein grafts were placed to the obtuse marginal and posterior descending coronary arteries.  Following the procedure, he was transferred to the surgical ICU in stable condition.  Vital signs and hemodynamics remained stable.  He remained in sinus rhythm.  He was weaned from the ventilator and extubated in the late afternoon on the day of surgery.  Intravenous nicardipine  was continued at low-dose to optimize radial graft patency over the first 24 hours after surgery. This was transition to oral amlodipine  on postop day 1. IV phenylephrine   was weaned off by the morning of postop day 1. He was felt stable for transfer to the progressive unit on POD2. He was ambulating well on room air. Diet was improving and his bowels began moving appropriately. His incisions were healing well without sign of infection. He was felt stable for discharge home.

## 2024-04-24 NOTE — Op Note (Signed)
 [address]       [place] [address]             [phone]                                          04/24/2024 Patient:  KYEN TAITE Pre-Op Dx: 3V CAD HTN HLP   Post-op Dx:  same Procedure: CABG X 4.  LIMA LAD, RSVG PDA OM, L Radial RI (T graft off OM vein graft) Endoscopic greater saphenous vein harvest on the right Open left radial artery harvest   Surgeon and Role:      * Asti Mackley, Marinell Siad, MD - Primary    * Pat Bonier , PA-C - assisting An experienced assistant was required given the complexity of this surgery and the standard of surgical care. The assistant was needed for exposure, dissection, suctioning, retraction of delicate tissues and sutures, instrument exchange and for overall help during this procedure.    Anesthesia  general EBL:  500ml Blood Administration: none Xclamp Time:  59 min Pump Time:   Drains: 24 F blake drain:  L, mediastinal  Wires: V Counts: correct   Indications: 53 y.o. male with 3V CAD, and history of syncopal episodes.  Although he denies any anginal symptoms, I think that his vagal episodes are concerning for coronary obstruction given his degree of obstructive disease.  I have recommended that he undergo CABG with use of the left radial artery.  He is agreeable to proceed.   Findings: Small LIMA, good vein, good radial artery.  Good PDA, small OM, good RI, good LAD.  Operative Technique: All invasive lines were placed in pre-op holding.  After the risks, benefits and alternatives were thoroughly discussed, the patient was brought to the operative theatre.  Anesthesia was induced, and the patient was prepped and draped in normal sterile fashion.  An appropriate surgical pause was performed, and pre-operative antibiotics were dosed accordingly.  We began with simultaneous incisions along the right leg for harvesting of the greater saphenous vein, the left arm for the radial artery and the chest  for the sternotomy.  In regards to the sternotomy, this was carried down with bovie cautery, and the sternum was divided with a reciprocating saw.  Meticulous hemostasis was obtained.  The left internal thoracic artery was exposed and harvested in in pedicled fashion.  The patient was systemically heparinized, and the artery was divided distally, and placed in a papaverine sponge.    The sternal elevator was removed, and a retractor was placed.  The pericardium was divided in the midline and fashioned into a cradle with pericardial stitches.   After we confirmed an appropriate ACT, the ascending aorta was cannulated in standard fashion.  The right atrial appendage was used for venous cannulation site.  Cardiopulmonary bypass was initiated, and the heart retractor was placed. The cross clamp was applied, and a dose of anterograde cardioplegia was given with good arrest of the heart.  We moved to the posterior wall of the heart, and found a good target on the PDA.  An arteriotomy was made, and the vein graft was anastomosed to it in an end to side fashion.  Next we exposed the lateral wall, and found a good target on the OM.  An end to side anastomosis with the vein graft was then created.  Next, we exposed the anterior wall of the heart and identified a good target on RI.   An arteriotomy was created.  The radial artery was anastomosed in an end to side fashion.  Finally, we exposed a good target on the  LAD, and fashioned an end to side anastomosis between it and the LITA.  We began to re-warm, and a re-animation dose of cardioplegia was given.  The heart was de-aired, and the cross clamp was removed.  Meticulous hemostasis was obtained.    A partial occludding clamp was then placed on the ascending aorta, and we created an end to side anastomosis between it and the proximal vein grafts.  The radial artery was jumped off the hood of the OM graft.  Rings were placed on the proximal anastomosis.  Hemostasis was  obtained, and we separated from cardiopulmonary bypass without event.  The heparin  was reversed with protamine.  Chest tubes and wires were placed, and the sternum was re-approximated with sternal wires.  The soft tissue and skin were re-approximated wth absorbable suture.    The patient tolerated the procedure without any immediate complications, and was transferred to the ICU in guarded condition.  Ameliana Brashear Ala Alice

## 2024-04-24 NOTE — Anesthesia Procedure Notes (Addendum)
 Procedure Name: Intubation Date/Time: 04/24/2024 11:05 AM  Performed by: Geannie Keener, CRNAPre-anesthesia Checklist: Patient identified, Patient being monitored, Timeout performed, Emergency Drugs available and Suction available Patient Re-evaluated:Patient Re-evaluated prior to induction Oxygen Delivery Method: Circle System Utilized Preoxygenation: Pre-oxygenation with 100% oxygen Induction Type: IV induction Ventilation: Mask ventilation without difficulty Laryngoscope Size: Mac and 4 Grade View: Grade II Tube type: Oral Tube size: 8.0 mm Number of attempts: 1 Airway Equipment and Method: Stylet Placement Confirmation: ETT inserted through vocal cords under direct vision, positive ETCO2 and breath sounds checked- equal and bilateral Secured at: 23 cm Tube secured with: Tape Dental Injury: Teeth and Oropharynx as per pre-operative assessment

## 2024-04-24 NOTE — Transfer of Care (Signed)
 Immediate Anesthesia Transfer of Care Note  Patient: Gary Castaneda  Procedure(s) Performed: CORONARY ARTERY BYPASS GRAFTING (CABG) TIMES FOUR UTILIZING LEFT INTERNAL MAMMARY ARTERY, LEFT RADIAL ARTERY, AND RIGHT ENDOSCOPIC VEIN HARVEST OF GREAT SAPENOUS VEIN., (Chest) SURGICAL PROCUREMENT, ARTERY, RADIAL (Left: Arm Lower) ECHOCARDIOGRAM, TRANSESOPHAGEAL, INTRAOPERATIVE  Patient Location: ICU  Anesthesia Type:General  Level of Consciousness: Patient remains intubated per anesthesia plan  Airway & Oxygen Therapy: Patient remains intubated per anesthesia plan and Patient placed on Ventilator (see vital sign flow sheet for setting)  Post-op Assessment: Report given to RN and Post -op Vital signs reviewed and stable  Post vital signs: Reviewed and stable  Last Vitals:  Vitals Value Taken Time  BP  90/78  Temp    Pulse 74 04/24/24 1644  Resp 16 04/24/24 1644  SpO2    Vitals shown include unfiled device data.  Last Pain:  Vitals:   04/24/24 0924  TempSrc:   PainSc: 0-No pain      Patients Stated Pain Goal: 0 (04/24/24 0924)  Complications: No notable events documented.

## 2024-04-24 NOTE — Interval H&P Note (Signed)
 History and Physical Interval Note:  04/24/2024 10:15 AM  Gary Castaneda  has presented today for surgery, with the diagnosis of CAD.  The various methods of treatment have been discussed with the patient and family. After consideration of risks, benefits and other options for treatment, the patient has consented to  Procedure(s): CORONARY ARTERY BYPASS GRAFTING (CABG) (N/A) SURGICAL PROCUREMENT, ARTERY, RADIAL (Left) ECHOCARDIOGRAM, TRANSESOPHAGEAL, INTRAOPERATIVE (N/A) as a surgical intervention.  The patient's history has been reviewed, patient examined, no change in status, stable for surgery.  I have reviewed the patient's chart and labs.  Questions were answered to the patient's satisfaction.     Gary Castaneda

## 2024-04-24 NOTE — Anesthesia Procedure Notes (Signed)
 Arterial Line Insertion Start/End5/29/2025 10:20 AM, 04/24/2024 10:23 AM Performed by: Arvie Latus, MD, Jerrod Damiano, Christy Crandall, CRNA, CRNA  Patient location: Pre-op. Preanesthetic checklist: patient identified, IV checked, site marked, risks and benefits discussed, surgical consent, monitors and equipment checked, pre-op evaluation, timeout performed and anesthesia consent Lidocaine  1% used for infiltration and patient sedated Right, radial was placed Catheter size: 20 G Hand hygiene performed , maximum sterile barriers used  and Seldinger technique used Allen's test indicative of satisfactory collateral circulation Attempts: 1 Procedure performed without using ultrasound guided technique. Following insertion, Biopatch and dressing applied. Post procedure assessment: normal and unchanged  Patient tolerated the procedure well with no immediate complications.

## 2024-04-24 NOTE — Discharge Instructions (Signed)

## 2024-04-24 NOTE — Consult Note (Signed)
 NAME:  Gary Castaneda, MRN:  [id number], DOB:  17-Jul-1971, LOS: 0 ADMISSION DATE:  04/24/2024, CONSULTATION DATE:  04/24/24 REFERRING MD: Gary Eastern, MD CHIEF COMPLAINT:  post op critical care    History of Present Illness:  53 year old male with PMH of coronary calcifications, 3V CAD, HTN, HLD. He denies any chest pain or exertional symptoms but has a significant family history of early CAD in which he is followed closely by Cardiology. In early May of this year he underwent a coronary CT which revealed obstructive CAD in the RCA and LAD regions and increase in his calcium  score from 1130 to 2855. He underwent a left heart cath on 5/13 and found multiple lesions throughout the RCA and severe multivessel CAD. Post-op echo revealed an EF of 60-65%. Following the left heart cath he has complained of his legs feeling unusually heavy and endorses long history of vasovagal episodes. He was referred to [facility] for CABG.  On 5/29 Gary Castaneda underwent a CABG with use of the left radial artery; admitted to CVICU post operatively  Time on bypass: 1hr EBL: Cell sever Arrived to unit on: Phenyl 5, nicardipine  5  Chest tubes volume: 50mL  Pertinent  Medical History  CAD, HTN, HLD, coronary calcifications   Significant Hospital Events: Including procedures, antibiotic start and stop dates in addition to other pertinent events   5/29: admitted to ICU post op CABG; arrived intubated, pleural and mediastinal chest tubes in place, phenylephrine  and nicardipine .   Interim History / Subjective:  Sedated on mechanical ventilation   Objective    Blood pressure 129/80, pulse 67, temperature 98.4 F (36.9 C), temperature source Oral, resp. rate 12, height 5\' 6"  (1.676 m), weight 69.4 kg, SpO2 100%.        Intake/Output Summary (Last 24 hours) at 04/24/2024 1419 Last data filed at 04/24/2024 1400 Gross per 24 hour  Intake 1450 ml  Output 1025 ml  Net 425 ml   Filed Weights    04/24/24 0913  Weight: 69.4 kg    Examination: General: Orally intubated, sedated HENT: normocephalic, orally intubated, eyes non-icteric, mucous membranes moist Lungs: Clear breath sounds, on PRVC on vent, overbreathing vent, chest tubes in place, no use of accessory muscles.  Cardiovascular: Pericardial friction rub on auscultation, regular rate and rhythm, palpable pulses, extremities warm Abdomen: soft non distended, active bowel sounds in all quadrants Extremities: Warm, dry, pale, R leg wrapped, L arm wrapped Neuro: Following commands, sedated, opens eyes to voice. GU: Foley in place with yellow straw urine  Resolved problem list   Assessment and Plan  multivessel CAD  involving RCA and LAD.  S/P 4V CABG using right radial artery and R great saphenous vein 5/29 H/o HTN and HLD Plan Titrate inotropic support for CI > 2.2 Titrate vasopressors for MAP > 65 Cont fixed dose cardene  to prevent Radial artery vasospasm   Trend CVP Post op CXR Post op: EKG, Coag studies, CBC lactate, ABG Pain control Avoid fevers Tight glycemic control  Passive rewarming vs bair hugger Assess neurological exam once normothermic  Complete prophylactic  ABX;  Close obs of CT output Tubes/drains/surgical site per surg  Start post op statin and asa when able to take POs Post op bb when hemodynamically stable Holding home CCB and ace I for now   Post-op vent management  Plan F/u post op abg cxr reviewed ett essentially at cords so extubated as pt was already awake Keep SPO2 >92% Pulse ox  Early mobilization and IS post extubation   Expected post-operative anemia Plan Trend  Trigger for transfusion < 7 OR for active bleeding w/ hemodynamic compromise    Best Practice (right click and "Reselect all SmartList Selections" daily)   Diet/type: NPO DVT prophylaxis systemic heparin  Pressure ulcer(s): N/A GI prophylaxis: PPI Lines: Central line, Arterial Line, and yes and it is still  needed Foley:  Yes, and it is still needed Code Status:  full code Last date of multidisciplinary goals of care discussion [per primary ]  Labs   CBC: Recent Labs  Lab 04/22/24 1100 04/24/24 1139 04/24/24 1142 04/24/24 1309 04/24/24 1342 04/24/24 1353 04/24/24 1405  WBC 5.6  --   --   --   --   --   --   HGB 14.4   < > 12.6* 11.9* 8.8* 9.5* 9.5*  HCT 41.9   < > 37.0* 35.0* 26.0* 28.0* 28.0*  MCV 97.4  --   --   --   --   --   --   PLT 266  --   --   --   --   --   --    < > = values in this interval not displayed.    Basic Metabolic Panel: Recent Labs  Lab 04/22/24 1100 04/24/24 1139 04/24/24 1142 04/24/24 1309 04/24/24 1342 04/24/24 1353 04/24/24 1405  NA 137   < > 138 139 139 138 139  K 3.7   < > 2.9* 3.3* 3.6 4.0 3.7  CL 102  --  101 102  --   --  104  CO2 27  --   --   --   --   --   --   GLUCOSE 99  --  135* 114*  --   --  121*  BUN 14  --  16 14  --   --  13  CREATININE 1.12  --  0.90 0.80  --   --  0.80  CALCIUM  9.1  --   --   --   --   --   --    < > = values in this interval not displayed.   GFR: Estimated Creatinine Clearance: 99.7 mL/min (by C-G formula based on SCr of 0.8 mg/dL). Recent Labs  Lab 04/22/24 1100  WBC 5.6    Liver Function Tests: Recent Labs  Lab 04/22/24 1100  AST 48*  ALT 44  ALKPHOS 47  BILITOT 0.7  PROT 7.0  ALBUMIN 4.2   No results for input(s): "LIPASE", "AMYLASE" in the last 168 hours. No results for input(s): "AMMONIA" in the last 168 hours.  ABG    Component Value Date/Time   PHART 7.453 (H) 04/24/2024 1342   PCO2ART 32.9 04/24/2024 1342   PO2ART 369 (H) 04/24/2024 1342   HCO3 25.3 04/24/2024 1353   TCO2 27 04/24/2024 1405   ACIDBASEDEF 1.0 04/24/2024 1342   O2SAT 89 04/24/2024 1353     Coagulation Profile: Recent Labs  Lab 04/22/24 1100  INR 1.0    Cardiac Enzymes: No results for input(s): "CKTOTAL", "CKMB", "CKMBINDEX", "TROPONINI" in the last 168 hours.  HbA1C: Hgb A1c MFr Bld  Date/Time  Value Ref Range Status  04/22/2024 10:41 AM 4.9 4.8 - 5.6 % Final    Comment:    (NOTE) Diagnosis of Diabetes The following HbA1c ranges recommended by the American Diabetes Association (ADA) may be used as an aid in the diagnosis of diabetes mellitus.  Hemoglobin  Suggested A1C NGSP%              Diagnosis  <5.7                   Non Diabetic  5.7-6.4                Pre-Diabetic  >6.4                   Diabetic  <7.0                   Glycemic control for                       adults with diabetes.      CBG: No results for input(s): "GLUCAP" in the last 168 hours.  Review of Systems:   Not able   Past Medical History:  He,  has a past medical history of Coronary artery disease, HTN (hypertension), and Hyperlipidemia LDL goal < 100.   Surgical History:   Past Surgical History:  Procedure Laterality Date   CORONARY PRESSURE/FFR STUDY N/A 04/08/2024   Procedure: CORONARY PRESSURE/FFR STUDY;  Surgeon: Arleen Lacer, MD;  Location: [facility] INVASIVE CV LAB;  Service: Cardiovascular;  Laterality: N/A;   FRACTURE SURGERY Right    cheekbone   LEFT HEART CATH AND CORONARY ANGIOGRAPHY N/A 04/08/2024   Procedure: LEFT HEART CATH AND CORONARY ANGIOGRAPHY;  Surgeon: Arleen Lacer, MD;  Location: [facility] INVASIVE CV LAB;  Service: Cardiovascular;  Laterality: N/A;   NASAL SEPTUM SURGERY       Social History:   reports that he has never smoked. He has never used smokeless tobacco. He reports that he does not drink alcohol and does not use drugs.   Family History:  His family history includes Diabetes in his unknown relative; Heart attack in his unknown relative; Hypertension in his unknown relative; Kidney disease in his father; Stroke in his unknown relative.   Allergies No Known Allergies   Home Medications  Prior to Admission medications   Medication Sig Start Date End Date Taking? Authorizing Provider  amLODipine (NORVASC) 2.5 MG tablet Take 2.5 mg by mouth  daily.   Yes [provider]  Ascorbic Acid (VITAMIN C PO) Take 1 tablet by mouth daily.   Yes [provider]  aspirin  EC 81 MG tablet Take 81 mg by mouth at bedtime.   Yes [provider]  Cholecalciferol (D3 PO) Take 1 tablet by mouth daily.   Yes [provider]  ezetimibe (ZETIA) 10 MG tablet Take 10 mg by mouth daily. 09/22/21  Yes [provider]  Ferrous Sulfate (IRON PO) Take 1 tablet by mouth daily.   Yes [provider]  lisinopril-hydrochlorothiazide (ZESTORETIC) 20-25 MG tablet Take 1 tablet by mouth daily.   Yes [provider]  Menaquinone-7 (K2 PO) Take 1 tablet by mouth daily.   Yes [provider]  nebivolol  (BYSTOLIC ) 10 MG tablet Take 10 mg by mouth daily.   Yes [provider]  REPATHA  SURECLICK 140 MG/ML SOAJ Inject 1 mL into the skin every 14 (fourteen) days.   Yes [provider]  rosuvastatin  (CRESTOR ) 20 MG tablet TAKE (1) TABLET DAILY AT BEDTIME. 06/15/20  Yes Hugh Madura, MD  Evolocumab  with Infusor (REPATHA  PUSHTRONEX SYSTEM) 420 MG/3.5ML SOCT Inject 420 mg into the skin every 30 (thirty) days. Patient not taking: Reported on 04/22/2024 05/28/23     lisinopril (ZESTRIL) 20  MG tablet Take 20 mg by mouth daily. Patient not taking: Reported on 04/22/2024    [provider]     Critical care time: 32 min

## 2024-04-24 NOTE — Progress Notes (Signed)
 04/24/2024 Patient tube almost out on CXR He's awake following commands CXR clear. Fine to extubate.  Ardelle Kos MD PCCM

## 2024-04-24 NOTE — Discharge Summary (Signed)
 Discharged home in stable condition discharged home in stable condition discharged to home Physician Discharge Summary  Patient ID: Gary Castaneda MRN: [id number] DOB/AGE: [birth year] 53 y.o.  Admit date: 04/24/2024 Discharge date: 04/28/2024  Admission Diagnoses:  Multivessel coronary artery disease Syncope Hypertension Dyslipidemia   Discharge Diagnoses:   Multivessel coronary artery disease Syncope Hypertension Dyslipidemia Status post coronary artery bypass grafting x 4 Expected acute blood loss anemia   Discharged Condition: stable  Referring: Arleen Lacer, MD Primary Care: Jearlean Mince, PA-C Primary Cardiologist:Mark Renna Cary, MD  History of Present Illness:     Gary Castaneda is a 53 y.o. 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  He has a strong family history of early cardiac disease, and has been followed closely since his 46s.  He underwent a coronary CT, which was then followed by a LHC recently.   Overall, he denies any anginal symptoms.  He exercises on the elliptical for 45 minutes at a time without problems.  His biggest complaint is the fact that his legs occasionally feel heavy.  He does endorse a long history of vasovagal episodes, and recently had one during his work-up after looking at blood.  At the time his blood pressure dropped to the 70s.  He also passed out recently in the shower.  Although he denies any anginal symptoms, I think that his vagal episodes are concerning for coronary obstruction given his degree of obstructive disease. I have recommended that he undergo CABG with use of the left radial artery. He is agreeable to proceed.   Hospital Course: [name] was admitted for elective surgery on 04/24/2024.  He was taken to the OR where CABG x 4 was accomplished.  The left internal mammary artery was grafted to the left anterior descending coronary artery.  The left radial graft was placed to the ramus intermediate coronary  artery.  Separate saphenous vein grafts were placed to the obtuse marginal and posterior descending coronary arteries.  Following the procedure, he was transferred to the surgical ICU in stable condition.  Vital signs and hemodynamics remained stable.  He remained in sinus rhythm.  He was weaned from the ventilator and extubated in the late afternoon on the day of surgery.  Intravenous nicardipine  was continued at low-dose to optimize radial graft patency over the first 24 hours after surgery. This was transition to oral amlodipine  on postop day 1. IV phenylephrine  was weaned off by the morning of postop day 1. He was felt stable for transfer to the progressive unit on POD2. He was ambulating well on room air. Diet was improving and his bowels began moving appropriately. His incisions were healing well without sign of infection. He remained in a stable sinus rhythm.  He was felt stable for discharge home.      Consults: pulmonary/intensive care  Significant Diagnostic Studies:   CLINICAL DATA:  91209.  Atelectasis.   EXAM: CHEST - 2 VIEW   COMPARISON:  Portable chest 04/26/2024 at 5:45 a.m.   FINDINGS: 5:54 a.m. Interval removed right IJ catheter. No measurable pneumothorax.   Still seen are small layering pleural effusions and asymmetric increasing consolidation in the left lower lobe, increased platelike atelectasis left mid field.   There is continued right infrahilar linear atelectasis without further consolidations.   There are sternotomy/CABG changes, mild cardiomegaly without CHF.   Stable mediastinum.  No new osseous finding.   IMPRESSION: 1. Interval removed right IJ catheter. No measurable pneumothorax. 2. Small layering pleural  effusions not significantly changed, with asymmetric consolidation increasing in the left lower lobe, increased platelike atelectasis left mid field. 3. Continued right infrahilar linear atelectasis.     Electronically Signed   By: Denman Fischer M.D.   On: 04/27/2024 07:51    Treatments: Surgery 04/24/2024 Patient:  Gary Castaneda Pre-Op Dx: 3V CAD HTN HLP   Post-op Dx:  same Procedure: CABG X 4.  LIMA LAD, RSVG PDA OM, L Radial RI (T graft off OM vein graft) Endoscopic greater saphenous vein harvest on the right Open left radial artery harvest     Surgeon and Role:      * Lightfoot, Marinell Siad, MD - Primary    * Pat Bonier , PA-C - assisting An experienced assistant was required given the complexity of this surgery and the standard of surgical care. The assistant was needed for exposure, dissection, suctioning, retraction of delicate tissues and sutures, instrument exchange and for overall help during this procedure.     Anesthesia  general EBL:  500ml Blood Administration: none Xclamp Time:  59 min Pump Time:    Drains: 16 F blake drain:  L, mediastinal  Wires: V Counts: correct     Indications: 53 y.o. male with 3V CAD, and history of syncopal episodes.  Although he denies any anginal symptoms, I think that his vagal episodes are concerning for coronary obstruction given his degree of obstructive disease.  I have recommended that he undergo CABG with use of the left radial artery.  He is agreeable to proceed.    Findings: Small LIMA, good vein, good radial artery.  Good PDA, small OM, good RI, good LAD.  Discharge Exam: Blood pressure 115/75, pulse 89, temperature 99.6 F (37.6 C), temperature source Oral, resp. rate 18, height 5\' 6"  (1.676 m), weight 68.1 kg, SpO2 99%. General appearance: alert, cooperative, and no distress Neurologic: intact Heart: RRR Lungs: normal work of breathing on RA. Clear breath sounds Abdomen: soft, no tenderness Extremities: well perfused, no peripheral edema. The LUE incision is intact and dry. Wound: the sternotomy incision is intact and dry.  Disposition:  Discharged to home in stable condition.   Discharge Instructions     Amb Referral to Cardiac  Rehabilitation   Complete by: As directed    Diagnosis: CABG   CABG X ___: 4   After initial evaluation and assessments completed: Virtual Based Care may be provided alone or in conjunction with Phase 2 Cardiac Rehab based on patient barriers.: Yes   Intensive Cardiac Rehabilitation (ICR) [facility] location only OR Traditional Cardiac Rehabilitation (TCR) *If criteria for ICR are not met will enroll in TCR ([facility] only): Yes      Allergies as of 04/28/2024   No Known Allergies      Medication List     STOP taking these medications    lisinopril 20 MG tablet Commonly known as: ZESTRIL   lisinopril-hydrochlorothiazide 20-25 MG tablet Commonly known as: ZESTORETIC   nebivolol  10 MG tablet Commonly known as: BYSTOLIC        TAKE these medications    amLODipine  5 MG tablet Commonly known as: NORVASC  Take 1 tablet (5 mg total) by mouth daily. What changed:  medication strength how much to take   aspirin  EC 325 MG tablet Take 1 tablet (325 mg total) by mouth daily. What changed:  medication strength how much to take when to take this   D3 PO Take 1 tablet by mouth daily.   ezetimibe 10 MG tablet  Commonly known as: ZETIA Take 10 mg by mouth daily.   IRON PO Take 1 tablet by mouth daily.   K2 PO Take 1 tablet by mouth daily.   metoprolol  tartrate 25 MG tablet Commonly known as: LOPRESSOR  Take 0.5 tablets (12.5 mg total) by mouth 2 (two) times daily.   oxyCODONE  5 MG immediate release tablet Commonly known as: Oxy IR/ROXICODONE  Take 1 tablet (5 mg total) by mouth every 6 (six) hours as needed for up to 7 days for severe pain (pain score 7-10).   potassium chloride  SA 20 MEQ tablet Commonly known as: KLOR-CON  M Take 1 tablet (20 mEq total) by mouth 2 (two) times daily for 1 day.   Repatha  SureClick 140 MG/ML Soaj Generic drug: Evolocumab  Inject 1 mL into the skin every 14 (fourteen) days.   Repatha  Pushtronex System 420 MG/3.5ML Soct Generic drug: Evolocumab  with  Infusor Inject 420 mg into the skin every 30 (thirty) days.   rosuvastatin  20 MG tablet Commonly known as: CRESTOR  TAKE (1) TABLET DAILY AT BEDTIME.   VITAMIN C PO Take 1 tablet by mouth daily.               Durable Medical Equipment  (From admission, onward)           Start     Ordered   04/27/24 1049  For home use only DME Walker rolling  Once       Question Answer Comment  Walker: With 5 Inch Wheels   Patient needs a walker to treat with the following condition Physical deconditioning      04/27/24 1048            Follow-up Information     Hilarie Lovely, MD. Call on 05/02/2024.   Specialty: Cardiothoracic Surgery Why: This is a virtual (telephone) follow-up visit.  Do not go to the office.  Dr. Deloise Ferries will call you around 3 PM Contact information: [address] [place] [address] [phone]         Gerald Kitty., NP. Go on 05/15/2024.   Specialty: Cardiology Why: Your appointment is at 10:55 AM. Contact information: [address] [place] [address] [phone]         [facility] [employer] Follow up.   Why: (Adapt) rolling walker arranged- to be delivered to pt prior to discharge Contact information: [address] [place] [address] [phone]                 The patient has been discharged on:   1.Beta Blocker:  Yes [ x ]                              No   [   ]                              If No, reason:  2.Ace Inhibitor/ARB: Yes [   ]                                     No  [ x   ]                                     If  No, reason: Soft BP  3.Statin:   Yes [ x  ]                  No  [   ]                  If No, reason:  4.Ecasa:  Yes  [ x ]                  No   [   ]                  If No, reason:  5. ACS on Admission?  no  P2Y12 Inhibitor:  Yes  [   ]                               No  [x ]    Signed: Leata Providence, PA-C 04/28/2024, 12:47 PM

## 2024-04-25 ENCOUNTER — Inpatient Hospital Stay (HOSPITAL_COMMUNITY)

## 2024-04-25 ENCOUNTER — Encounter (HOSPITAL_COMMUNITY): Payer: Self-pay | Admitting: Thoracic Surgery (Cardiothoracic Vascular Surgery)

## 2024-04-25 DIAGNOSIS — Z951 Presence of aortocoronary bypass graft: Secondary | ICD-10-CM | POA: Diagnosis not present

## 2024-04-25 DIAGNOSIS — E785 Hyperlipidemia, unspecified: Secondary | ICD-10-CM | POA: Diagnosis not present

## 2024-04-25 DIAGNOSIS — I10 Essential (primary) hypertension: Secondary | ICD-10-CM | POA: Diagnosis not present

## 2024-04-25 LAB — BASIC METABOLIC PANEL WITH GFR
Anion gap: 10 (ref 5–15)
Anion gap: 7 (ref 5–15)
Anion gap: 9 (ref 5–15)
BUN: 13 mg/dL (ref 6–20)
BUN: 7 mg/dL (ref 6–20)
BUN: 9 mg/dL (ref 6–20)
CO2: 22 mmol/L (ref 22–32)
CO2: 25 mmol/L (ref 22–32)
CO2: 26 mmol/L (ref 22–32)
Calcium: 6.7 mg/dL — ABNORMAL LOW (ref 8.9–10.3)
Calcium: 7.6 mg/dL — ABNORMAL LOW (ref 8.9–10.3)
Calcium: 8.5 mg/dL — ABNORMAL LOW (ref 8.9–10.3)
Chloride: 104 mmol/L (ref 98–111)
Chloride: 108 mmol/L (ref 98–111)
Chloride: 99 mmol/L (ref 98–111)
Creatinine, Ser: 0.77 mg/dL (ref 0.61–1.24)
Creatinine, Ser: 0.84 mg/dL (ref 0.61–1.24)
Creatinine, Ser: 1.12 mg/dL (ref 0.61–1.24)
GFR, Estimated: >60 mL/min (ref >60)
GFR, Estimated: >60 mL/min (ref >60)
GFR, Estimated: >60 mL/min (ref >60)
Glucose, Bld: 113 mg/dL — ABNORMAL HIGH (ref 70–99)
Glucose, Bld: 143 mg/dL — ABNORMAL HIGH (ref 70–99)
Glucose, Bld: 158 mg/dL — ABNORMAL HIGH (ref 70–99)
Potassium: 3.7 mmol/L (ref 3.5–5.1)
Potassium: 3.8 mmol/L (ref 3.5–5.1)
Potassium: 3.9 mmol/L (ref 3.5–5.1)
Sodium: 135 mmol/L (ref 135–145)
Sodium: 137 mmol/L (ref 135–145)
Sodium: 138 mmol/L (ref 135–145)

## 2024-04-25 LAB — CBC
HCT: 26.9 % — ABNORMAL LOW (ref 39.0–52.0)
HCT: 27.8 % — ABNORMAL LOW (ref 39.0–52.0)
Hemoglobin: 9.3 g/dL — ABNORMAL LOW (ref 13.0–17.0)
Hemoglobin: 9.2 g/dL — ABNORMAL LOW (ref 13.0–17.0)
MCH: 33.7 pg (ref 26.0–34.0)
MCH: 33 pg (ref 26.0–34.0)
MCHC: 34.6 g/dL (ref 30.0–36.0)
MCHC: 33.1 g/dL (ref 30.0–36.0)
MCV: 97.5 fL (ref 80.0–100.0)
MCV: 99.6 fL (ref 80.0–100.0)
Platelets: 178 10*3/uL (ref 150–400)
Platelets: 181 10*3/uL (ref 150–400)
RBC: 2.76 MIL/uL — ABNORMAL LOW (ref 4.22–5.81)
RBC: 2.79 MIL/uL — ABNORMAL LOW (ref 4.22–5.81)
RDW: 11.9 % (ref 11.5–15.5)
RDW: 12 % (ref 11.5–15.5)
WBC: 8.6 10*3/uL (ref 4.0–10.5)
WBC: 8.9 10*3/uL (ref 4.0–10.5)
nRBC: 0 % (ref 0.0–0.2)
nRBC: 0 % (ref 0.0–0.2)

## 2024-04-25 LAB — GLUCOSE, CAPILLARY
Glucose-Capillary: 102 mg/dL — ABNORMAL HIGH (ref 70–99)
Glucose-Capillary: 105 mg/dL — ABNORMAL HIGH (ref 70–99)
Glucose-Capillary: 106 mg/dL — ABNORMAL HIGH (ref 70–99)
Glucose-Capillary: 109 mg/dL — ABNORMAL HIGH (ref 70–99)
Glucose-Capillary: 113 mg/dL — ABNORMAL HIGH (ref 70–99)
Glucose-Capillary: 118 mg/dL — ABNORMAL HIGH (ref 70–99)
Glucose-Capillary: 125 mg/dL — ABNORMAL HIGH (ref 70–99)
Glucose-Capillary: 131 mg/dL — ABNORMAL HIGH (ref 70–99)
Glucose-Capillary: 134 mg/dL — ABNORMAL HIGH (ref 70–99)
Glucose-Capillary: 135 mg/dL — ABNORMAL HIGH (ref 70–99)
Glucose-Capillary: 138 mg/dL — ABNORMAL HIGH (ref 70–99)
Glucose-Capillary: 140 mg/dL — ABNORMAL HIGH (ref 70–99)
Glucose-Capillary: 141 mg/dL — ABNORMAL HIGH (ref 70–99)
Glucose-Capillary: 142 mg/dL — ABNORMAL HIGH (ref 70–99)
Glucose-Capillary: 82 mg/dL (ref 70–99)
Glucose-Capillary: 95 mg/dL (ref 70–99)

## 2024-04-25 LAB — MAGNESIUM
Magnesium: 2.1 mg/dL (ref 1.7–2.4)
Magnesium: 2.4 mg/dL (ref 1.7–2.4)
Magnesium: 2.7 mg/dL — ABNORMAL HIGH (ref 1.7–2.4)

## 2024-04-25 MED ORDER — AMLODIPINE BESYLATE 5 MG PO TABS
2.5000 mg | ORAL_TABLET | Freq: Every day | ORAL | Status: DC
  Administered 2024-04-25: 2.5 mg via ORAL
  Filled 2024-04-25: qty 1

## 2024-04-25 MED ORDER — INSULIN GLARGINE-YFGN 100 UNIT/ML ~~LOC~~ SOLN
4.0000 [IU] | Freq: Once | SUBCUTANEOUS | Status: DC
  Filled 2024-04-25: qty 0.04

## 2024-04-25 MED ORDER — POTASSIUM CHLORIDE 10 MEQ/50ML IV SOLN
10.0000 meq | INTRAVENOUS | Status: AC
  Administered 2024-04-25 (×3): 10 meq via INTRAVENOUS
  Filled 2024-04-25 (×3): qty 50

## 2024-04-25 MED ORDER — INSULIN ASPART 100 UNIT/ML IJ SOLN: 0.0000 [IU] | INTRAMUSCULAR | Status: DC

## 2024-04-25 MED ORDER — ATORVASTATIN CALCIUM 80 MG PO TABS
80.0000 mg | ORAL_TABLET | Freq: Every day | ORAL | Status: DC
  Administered 2024-04-25: 80 mg via ORAL
  Filled 2024-04-25: qty 1

## 2024-04-25 MED ORDER — INSULIN ASPART 100 UNIT/ML IJ SOLN
0.0000 [IU] | INTRAMUSCULAR | Status: DC
  Administered 2024-04-25 – 2024-04-26 (×3): 2 [IU] via SUBCUTANEOUS

## 2024-04-25 MED ORDER — CALCIUM GLUCONATE-NACL 2-0.675 GM/100ML-% IV SOLN
2.0000 g | Freq: Once | INTRAVENOUS | Status: AC
  Administered 2024-04-25: 2000 mg via INTRAVENOUS
  Filled 2024-04-25: qty 100

## 2024-04-25 MED ORDER — ENOXAPARIN SODIUM 40 MG/0.4ML IJ SOSY
40.0000 mg | PREFILLED_SYRINGE | Freq: Every day | INTRAMUSCULAR | Status: DC
  Administered 2024-04-25 – 2024-04-27 (×3): 40 mg via SUBCUTANEOUS
  Filled 2024-04-25 (×3): qty 0.4

## 2024-04-25 MED ORDER — INSULIN GLARGINE-YFGN 100 UNIT/ML ~~LOC~~ SOLN: 4.0000 [IU] | Freq: Every day | SUBCUTANEOUS | Status: DC

## 2024-04-25 MED ORDER — INSULIN GLARGINE-YFGN 100 UNIT/ML ~~LOC~~ SOLN
4.0000 [IU] | Freq: Once | SUBCUTANEOUS | Status: AC
  Administered 2024-04-25: 4 [IU] via SUBCUTANEOUS
  Filled 2024-04-25: qty 0.04

## 2024-04-25 MED FILL — Lidocaine HCl Local Preservative Free (PF) Inj 2%: INTRAMUSCULAR | Qty: 14 | Status: AC

## 2024-04-25 MED FILL — Tranexamic Acid IV Soln 1000 MG/10ML (100 MG/ML): INTRAVENOUS | Qty: 3000 | Status: AC

## 2024-04-25 MED FILL — Potassium Chloride Inj 2 mEq/ML: INTRAVENOUS | Qty: 40 | Status: AC

## 2024-04-25 MED FILL — Heparin Sodium (Porcine) Inj 1000 Unit/ML: Qty: 1000 | Status: AC

## 2024-04-25 NOTE — TOC Initial Note (Signed)
 Transition of Care [facility]) - Initial/Assessment Note    Patient Details  Name: Gary Castaneda MRN: [id number] Date of Birth: 1971/06/08  Transition of Care [facility]) CM/SW Contact:    Benjiman Bras, RN Phone Number: [phone] 04/25/2024, 4:16 PM  Clinical Narrative:                  TOC CM spoke to pt and wife at bedside. Pt states he works full-time. Provided wife with contact to have FMLA/STD completed by surgeon's office.   Will continue to follow for dc needs.  Expected Discharge Plan: Home w Home Health Services Barriers to Discharge: Continued Medical Work up   Patient Goals and CMS Choice Patient states their goals for this hospitalization and ongoing recovery are:: wants to recover          Expected Discharge Plan and Services   Discharge Planning Services: CM Consult   Living arrangements for the past 2 months: Single Family Home                                      Prior Living Arrangements/Services Living arrangements for the past 2 months: Single Family Home Lives with:: Spouse Patient language and need for interpreter reviewed:: Yes Do you feel safe going back to the place where you live?: Yes      Need for Family Participation in Patient Care: Yes (Comment) Care giver support system in place?: Yes (comment)   Criminal Activity/Legal Involvement Pertinent to Current Situation/Hospitalization: No - Comment as needed  Activities of Daily Living   ADL Screening (condition at time of admission) Independently performs ADLs?: Yes (appropriate for developmental age) Is the patient deaf or have difficulty hearing?: No Does the patient have difficulty seeing, even when wearing glasses/contacts?: No Does the patient have difficulty concentrating, remembering, or making decisions?: No  Permission Sought/Granted Permission sought to share information with : Case Manager, Family Supports, PCP Permission granted to share information with : Yes,  Verbal Permission Granted  Share Information with NAME: Gary Castaneda     Permission granted to share info w Relationship: wife  Permission granted to share info w Contact Information: [phone]  Emotional Assessment Appearance:: Appears stated age Attitude/Demeanor/Rapport: Engaged Affect (typically observed): Accepting Orientation: : Oriented to Self, Oriented to Place, Oriented to  Time, Oriented to Situation   Psych Involvement: No (comment)  Admission diagnosis:  Coronary artery disease involving native coronary artery of native heart without angina pectoris [I25.10] S/P CABG x 4 [Z95.1] Patient Active Problem List   Diagnosis Date Noted   S/P CABG x 4 04/24/2024   Mixed hyperlipidemia 12/21/2021   Coronary artery calcification 12/21/2021   Essential hypertension 01/16/2014   PCP:  Jearlean Mince, PA-C Pharmacy:   [facility] [place], [place] - [address] [place] [address] Phone: [phone] Fax: [phone]     Social Drivers of Health (SDOH) Social History: SDOH Screenings   Food Insecurity: No Food Insecurity (10/17/2023)   Received from [facility]  Housing: Low Risk  (10/17/2023)   Received from [facility]  Transportation Needs: No Transportation Needs (10/17/2023)   Received from [facility]  Utilities: Not At Risk (10/17/2023)   Received from [facility]  Financial Resource Strain: Low Risk  (10/17/2023)   Received from [facility]  Social Connections: Unknown (04/01/2022)   Received  from [facility]  Tobacco Use: Low Risk  (04/24/2024)   SDOH Interventions:     Readmission Risk Interventions     No data to display

## 2024-04-25 NOTE — Plan of Care (Signed)

## 2024-04-25 NOTE — Plan of Care (Signed)
  Problem: Clinical Measurements: Goal: Will remain free from infection Outcome: Progressing Goal: Diagnostic test results will improve Outcome: Progressing Goal: Respiratory complications will improve Outcome: Progressing Goal: Cardiovascular complication will be avoided Outcome: Progressing   Problem: Activity: Goal: Risk for activity intolerance will decrease Outcome: Progressing   Problem: Coping: Goal: Level of anxiety will decrease Outcome: Progressing   

## 2024-04-25 NOTE — Progress Notes (Addendum)
 TCTS DAILY ICU PROGRESS NOTE                   [address]            [place] [address]          [phone]   1 Day Post-Op Procedure(s) (LRB): CORONARY ARTERY BYPASS GRAFTING (CABG) TIMES FOUR UTILIZING LEFT INTERNAL MAMMARY ARTERY, LEFT RADIAL ARTERY, AND RIGHT ENDOSCOPIC VEIN HARVEST OF GREAT SAPENOUS VEIN., (N/A) SURGICAL PROCUREMENT, ARTERY, RADIAL (Left) ECHOCARDIOGRAM, TRANSESOPHAGEAL, INTRAOPERATIVE (N/A)  Total Length of Stay:  LOS: 1 day   Subjective: Extubated last evening Awake and alert, having some nausea.  Rates pain at 6/10 and control has been reasonable.    Phenylephrine infusion is off. Nicardipine infusing at 1 mg/h (for radial graft patency)   Objective: Vital signs in last 24 hours: Temp:  [96.1 F (35.6 C)-99.7 F (37.6 C)] 99.5 F (37.5 C) (05/30 0645) Pulse Rate:  [66-92] 81 (05/30 0700) Cardiac Rhythm: Normal sinus rhythm (05/30 0400) Resp:  [6-26] 12 (05/30 0700) BP: (129-171)/(80-90) 129/80 (05/29 1025) SpO2:  [89 %-100 %] 94 % (05/30 0700) Arterial Line BP: (82-122)/(42-57) 98/45 (05/30 0700) FiO2 (%):  [50 %] 50 % (05/29 1646) Weight:  [69.4 kg-70.3 kg] 70.3 kg (05/30 0615)  Filed Weights   04/24/24 0913 04/25/24 0615  Weight: 69.4 kg 70.3 kg    Weight change:  1kg+ over preop weight  Hemodynamic parameters for last 24 hours: CVP:  [0 mmHg-7 mmHg] 6 mmHg CO:  [4.6 L/min-9.8 L/min] 7.2 L/min CI:  [2.6 L/min/m2-5.5 L/min/m2] 4.1 L/min/m2  Intake/Output from previous day: 05/29 0701 - 05/30 0700 In: 5763.5 [P.O.:680; I.V.:2236.3; Blood:225; IV Piggyback:2622.3] Out: 5770 [Urine:4880; Blood:480; Chest Tube:410]  Intake/Output this shift: No intake/output data recorded.  Current Meds: Scheduled Meds:  acetaminophen   1,000 mg Oral Q6H   Or   acetaminophen  (TYLENOL ) oral liquid 160 mg/5 mL  1,000 mg Per Tube Q6H   aspirin  EC  325 mg Oral Daily   Or   aspirin   324 mg Per Tube Daily   bisacodyl  10 mg Oral  Daily   Or   bisacodyl  10 mg Rectal Daily   Chlorhexidine Gluconate Cloth  6 each Topical Daily   docusate sodium  200 mg Oral Daily   insulin aspart  0-24 Units Subcutaneous Q4H   [START ON 04/26/2024] insulin glargine-yfgn  4 Units Subcutaneous Daily   insulin glargine-yfgn  4 Units Subcutaneous Once   metoCLOPramide (REGLAN) injection  10 mg Intravenous Q6H   metoprolol  tartrate  12.5 mg Oral BID   Or   metoprolol  tartrate  12.5 mg Per Tube BID   [START ON 04/26/2024] pantoprazole  40 mg Oral Daily   pantoprazole (PROTONIX) IV  40 mg Intravenous QHS   sodium chloride  flush  3 mL Intravenous Q12H   sodium chloride  flush  3-10 mL Intravenous Q12H   sodium chloride  flush  3-10 mL Intravenous Q12H   Continuous Infusions:  sodium chloride  20 mL/hr at 04/25/24 0700   albumin human Stopped (04/24/24 2001)   calcium  gluconate      ceFAZolin (ANCEF) IV Stopped (04/25/24 0550)   lactated ringers 20 mL/hr at 04/25/24 0700   niCARDipine 1 mg/hr (04/25/24 0700)   phenylephrine (NEO-SYNEPHRINE) Adult infusion Stopped (04/25/24 0621)   PRN Meds:.albumin human, metoprolol  tartrate, morphine injection, ondansetron  (ZOFRAN ) IV, mouth rinse, oxyCODONE, sodium chloride  flush, sodium chloride  flush, sodium chloride  flush, traMADol  General appearance: alert, cooperative, and mild  distress Neurologic: intact Heart: Regular rate and rhythm, no significant arrhythmias.  Friction rub over the left sternal border suspect related to the mediastinal drain. Lungs: Normal work of breathing, adequate oxygen saturation on 2 L nasal cannula.  No unexpected findings on the chest x-ray, lungs well-expanded. Abdomen: Soft, no tenderness. Extremities: Warm and well-perfused, nonedematous.  The right lower extremity EVH incision is covered with a dry dressing.  No bruising or hematoma right thigh.  Left hand is well-perfused with no motor or sensory deficits Wound: Sternotomy incision is covered with a dry Aquacel  dressing.  Lab Results: CBC: Recent Labs    04/24/24 2331 04/25/24 0502  WBC 10.5 8.6  HGB 9.6* 9.3*  HCT 27.7* 26.9*  PLT 180 178   BMET:  Recent Labs    04/24/24 2331 04/25/24 0502  NA 138 137  K 3.7 3.9  CL 104 108  CO2 25 22  GLUCOSE 143* 113*  BUN 9 7  CREATININE 0.84 0.77  CALCIUM  7.6* 6.7*    CMET: Lab Results  Component Value Date   WBC 8.6 04/25/2024   HGB 9.3 (L) 04/25/2024   HCT 26.9 (L) 04/25/2024   PLT 178 04/25/2024   GLUCOSE 113 (H) 04/25/2024   ALT 44 04/22/2024   AST 48 (H) 04/22/2024   NA 137 04/25/2024   K 3.9 04/25/2024   CL 108 04/25/2024   CREATININE 0.77 04/25/2024   BUN 7 04/25/2024   CO2 22 04/25/2024   INR 1.4 (H) 04/24/2024   HGBA1C 4.9 04/22/2024      PT/INR:  Recent Labs    04/24/24 1653  LABPROT 17.0*  INR 1.4*   Radiology: DG Chest Port 1 View Result Date: 04/24/2024 CLINICAL DATA:  Post CABG EXAM: PORTABLE CHEST 1 VIEW COMPARISON:  04/22/2024 FINDINGS: Changes of CABG. Endotracheal tube tip is above the thoracic inlet, proximally 12.5 cm above the carina. Swan-Ganz catheter tip in the central right pulmonary artery. Left chest tube in place. NG tube is in the stomach. Heart mediastinal contours within normal limits. No confluent opacities, effusions or pneumothorax. IMPRESSION: Support devices as above. Endotracheal tube is 12.5 cm above the carina in the cervical region. No pneumothorax.  No acute cardiopulmonary disease. Electronically Signed   By: Janeece Mechanic M.D.   On: 04/24/2024 19:04     Assessment/Plan: S/P Procedure(s) (LRB): CORONARY ARTERY BYPASS GRAFTING (CABG) TIMES FOUR UTILIZING LEFT INTERNAL MAMMARY ARTERY, LEFT RADIAL ARTERY, AND RIGHT ENDOSCOPIC VEIN HARVEST OF GREAT SAPENOUS VEIN., (N/A) SURGICAL PROCUREMENT, ARTERY, RADIAL (Left) ECHOCARDIOGRAM, TRANSESOPHAGEAL, INTRAOPERATIVE (N/A)  - Postop day 1 CABG x 4 for multivessel coronary artery disease with preserved biventricular function.  Phenylephrine  infusion is off, vital signs and hemodynamics stable since surgery.  Will transition the nicardipine to oral amlodipine to optimize radial graft patency.  DC monitoring lines and mobilize.  - Pulm: Oxygenating well on 2 L nasal cannula.  Work on pulmonary hygiene as able.  Leaving chest tubes in place for now but may be able to remove later.  -Endo: No history of diabetes mellitus, preop hemoglobin A1c 4.9.  CBGs well-controlled with insulin drip, transition to SSI today.  -Heme: Mild expected acute blood loss anemia and thrombocytopenia.  Tolerating okay.  Monitoring  -Renal: Normal function at baseline.  Weight is only 1 kg above preop baseline.  Phenylephrine was just discontinued and BP is relatively soft.  Hold off on diuresis for now.  - GI: Nausea with an episode of vomiting earlier.  Treated with Zofran .  Abdominal exam is benign, continue clear liquids for now and advance diet slowly.  -DVT PPx: Begin enoxaparin today, mobilize as able.   Myron G. Roddenberry, PA-C 04/25/2024 7:40 AM   Agree Doing well Will transition to amlodipine D/c a line and wires Possible floor later today  Corday Wyka O Gerritt Galentine

## 2024-04-25 NOTE — Progress Notes (Signed)
      [address]       [place] [address]             [phone]    POD # 1  CABG  Still with incisional pain  BP 99/61   Pulse 75   Temp 98 F (36.7 C) (Oral)   Resp (!) 9   Ht 5\' 6"  (1.676 m)   Wt 70.3 kg   SpO2 96%   BMI 25.01 kg/m    Intake/Output Summary (Last 24 hours) at 04/25/2024 1625 Last data filed at 04/25/2024 1500 Gross per 24 hour  Intake 3820.64 ml  Output 3880 ml  Net -59.36 ml   CBG well controlled PM labs pending  Landon Pinion C. Luna Salinas, MD [facility] [phone]

## 2024-04-25 NOTE — Progress Notes (Signed)
 NAME:  Gary Castaneda, MRN:  [id number], DOB:  Jul 27, 1971, LOS: 1 ADMISSION DATE:  04/24/2024, CONSULTATION DATE:  04/24/24 REFERRING MD: Starleen Eastern, MD CHIEF COMPLAINT:  post op critical care    History of Present Illness:  53 year old male with PMH of coronary calcifications, 3V CAD, HTN, HLD. He denies any chest pain or exertional symptoms but has a significant family history of early CAD in which he is followed closely by Cardiology. In early May of this year he underwent a coronary CT which revealed obstructive CAD in the RCA and LAD regions and increase in his calcium  score from 1130 to 2855. He underwent a left heart cath on 5/13 and found multiple lesions throughout the RCA and severe multivessel CAD. Post-op echo revealed an EF of 60-65%. Following the left heart cath he has complained of his legs feeling unusually heavy and endorses long history of vasovagal episodes. He was referred to CVTS for CABG.  On 5/29 Mr. Biever underwent a CABG with use of the left radial artery; admitted to CVICU post operatively  Time on bypass: 1hr EBL: Cell sever Arrived to unit on: Phenyl 5, nicardipine 5  Chest tubes volume: 50mL  Pertinent  Medical History  CAD, HTN, HLD, coronary calcifications   Significant Hospital Events: Including procedures, antibiotic start and stop dates in addition to other pertinent events   5/29: admitted to ICU post op CABG; arrived intubated, pleural and mediastinal chest tubes in place, phenylephrine and nicardipine.  5/30: extubated yesterday. Okay today with some pain   Interim History / Subjective:  Extubated last night. On room air this morning. Does not feel short of breath. Having some mid chest and left shoulder pain this morning - TCTS aware. Otherwise feels okay. About net even. Does not appear volume overloaded. Turning of nicardipine today and will transition off insulin gtt.    Objective    Blood pressure 129/80, pulse 81,  temperature 99.5 F (37.5 C), resp. rate 12, height 5\' 6"  (1.676 m), weight 70.3 kg, SpO2 94%. CVP:  [0 mmHg-7 mmHg] 6 mmHg CO:  [4.6 L/min-9.8 L/min] 7.2 L/min CI:  [2.6 L/min/m2-5.5 L/min/m2] 4.1 L/min/m2  Vent Mode: SIMV;PSV;PRVC FiO2 (%):  [50 %] 50 % Set Rate:  [16 bmp] 16 bmp Vt Set:  [610 mL] 610 mL PEEP:  [5 cmH20] 5 cmH20 Pressure Support:  [10 cmH20] 10 cmH20   Intake/Output Summary (Last 24 hours) at 04/25/2024 0731 Last data filed at 04/25/2024 0700 Gross per 24 hour  Intake 5763.52 ml  Output 5770 ml  Net -6.48 ml   Filed Weights   04/24/24 0913 04/25/24 0615  Weight: 69.4 kg 70.3 kg    Examination: General: middle aged male, laying in bed, no acute distress  HENT: ncat, anicteric sclera, perrla, mucous membranes moist  Lungs: lungs CTAB, room air, resp even and unlabored  Cardiovascular: s1s2, friction rub to the left (TCTS aware), mediastinal drainage tubes in place, no JVD or lower extremity edema, extremities warm  Abdomen: soft, ntnd Extremities: Warm, dry, pale, R leg wrapped, L arm wrapped Neuro: awake, alert, oriented x3, non focal exam GU: Foley in place with yellow straw urine  Resolved problem list  Post op vent management  Assessment and Plan  Multivessel CAD involving RCA and LAD S/P 4V CABG using right radial artery and R great saphenous vein 5/29 Hypertension  Hyperlipidemia  - extubated 5/29 without incident  - transition off nicardipine gtt to amlodipine  - progress  of insulin gtt to ssi  - continue asa, statin, metoprolol   - no diuresis today, net even and does not appear overloaded  - con't chest tubes today with about 280cc since midnight  - complete post-op antibiotics  - start lovenox  - multimodal pain control  - mobilize  - appreciate TCTS management   Hypocalcemia  - 2g calcium  gluconate this morning  - trend  Expected post-operative anemia - Trend  - Trigger for transfusion < 7 OR for active bleeding w/ hemodynamic  compromise    Best Practice (right click and "Reselect all SmartList Selections" daily)   Diet/type: Regular consistency (see orders) DVT prophylaxis LMWH Pressure ulcer(s): N/A - per nursing GI prophylaxis: PPI Lines: Central line, Arterial Line, and No longer needed.  Order written to d/c  Foley:  N/A Code Status:  full code Last date of multidisciplinary goals of care discussion [per primary]  Labs   CBC: Recent Labs  Lab 04/22/24 1100 04/24/24 1139 04/24/24 1426 04/24/24 1457 04/24/24 1653 04/24/24 1654 04/24/24 1803 04/24/24 2331 04/25/24 0502  WBC 5.6  --   --   --  12.3*  --   --  10.5 8.6  NEUTROABS  --   --   --   --   --   --   --  8.8*  --   HGB 14.4   < > 9.7*   < > 10.9* 7.8* 9.5* 9.6* 9.3*  HCT 41.9   < > 27.8*   < > 30.8* 23.0* 28.0* 27.7* 26.9*  MCV 97.4  --   --   --  95.7  --   --  96.9 97.5  PLT 266  --  196  --  171  --   --  180 178   < > = values in this interval not displayed.    Basic Metabolic Panel: Recent Labs  Lab 04/22/24 1100 04/24/24 1139 04/24/24 1309 04/24/24 1342 04/24/24 1405 04/24/24 1457 04/24/24 1541 04/24/24 1654 04/24/24 1803 04/24/24 2331 04/25/24 0502  NA 137   < > 139   < > 139 137 139 145 143 138 137  K 3.7   < > 3.3*   < > 3.7 3.8 3.4* 2.5* 3.5 3.7 3.9  CL 102   < > 102  --  104 102  --   --   --  104 108  CO2 27  --   --   --   --   --   --   --   --  25 22  GLUCOSE 99   < > 114*  --  121* 132*  --   --   --  143* 113*  BUN 14   < > 14  --  13 14  --   --   --  9 7  CREATININE 1.12   < > 0.80  --  0.80 0.70  --   --   --  0.84 0.77  CALCIUM  9.1  --   --   --   --   --   --   --   --  7.6* 6.7*  MG  --   --   --   --   --   --   --   --   --  2.7* 2.1   < > = values in this interval not displayed.   GFR: Estimated Creatinine Clearance: 99.7 mL/min (by C-G formula based on SCr of 0.77 mg/dL). Recent Labs  Lab 04/22/24 1100 04/24/24 1653 04/24/24 2331 04/25/24 0502  WBC 5.6 12.3* 10.5 8.6    Liver  Function Tests: Recent Labs  Lab 04/22/24 1100  AST 48*  ALT 44  ALKPHOS 47  BILITOT 0.7  PROT 7.0  ALBUMIN 4.2   No results for input(s): "LIPASE", "AMYLASE" in the last 168 hours. No results for input(s): "AMMONIA" in the last 168 hours.  ABG    Component Value Date/Time   PHART 7.327 (L) 04/24/2024 1803   PCO2ART 46.1 04/24/2024 1803   PO2ART 107 04/24/2024 1803   HCO3 24.5 04/24/2024 1803   TCO2 26 04/24/2024 1803   ACIDBASEDEF 2.0 04/24/2024 1803   O2SAT 98 04/24/2024 1803     Coagulation Profile: Recent Labs  Lab 04/22/24 1100 04/24/24 1653  INR 1.0 1.4*    Cardiac Enzymes: No results for input(s): "CKTOTAL", "CKMB", "CKMBINDEX", "TROPONINI" in the last 168 hours.  HbA1C: Hgb A1c MFr Bld  Date/Time Value Ref Range Status  04/22/2024 10:41 AM 4.9 4.8 - 5.6 % Final    Comment:    (NOTE) Diagnosis of Diabetes The following HbA1c ranges recommended by the American Diabetes Association (ADA) may be used as an aid in the diagnosis of diabetes mellitus.  Hemoglobin             Suggested A1C NGSP%              Diagnosis  <5.7                   Non Diabetic  5.7-6.4                Pre-Diabetic  >6.4                   Diabetic  <7.0                   Glycemic control for                       adults with diabetes.      CBG: Recent Labs  Lab 04/25/24 0216 04/25/24 0306 04/25/24 0403 04/25/24 0500 04/25/24 0615  GLUCAP 125* 95 105* 118* 142*    Review of Systems:   As above   Past Medical History:  He,  has a past medical history of Coronary artery disease, HTN (hypertension), and Hyperlipidemia LDL goal < 100.   Surgical History:   Past Surgical History:  Procedure Laterality Date   CORONARY PRESSURE/FFR STUDY N/A 04/08/2024   Procedure: CORONARY PRESSURE/FFR STUDY;  Surgeon: Arleen Lacer, MD;  Location: [facility] INVASIVE CV LAB;  Service: Cardiovascular;  Laterality: N/A;   FRACTURE SURGERY Right    cheekbone   LEFT HEART CATH AND  CORONARY ANGIOGRAPHY N/A 04/08/2024   Procedure: LEFT HEART CATH AND CORONARY ANGIOGRAPHY;  Surgeon: Arleen Lacer, MD;  Location: [facility] INVASIVE CV LAB;  Service: Cardiovascular;  Laterality: N/A;   NASAL SEPTUM SURGERY       Social History:   reports that he has never smoked. He has never used smokeless tobacco. He reports that he does not drink alcohol and does not use drugs.   Family History:  His family history includes Diabetes in his unknown relative; Heart attack in his unknown relative; Hypertension in his unknown relative; Kidney disease in his father; Stroke in his unknown relative.   Allergies No Known Allergies   Home Medications  Prior to Admission medications   Medication Sig Start  Date End Date Taking? Authorizing Provider  amLODipine (NORVASC) 2.5 MG tablet Take 2.5 mg by mouth daily.   Yes [provider]  Ascorbic Acid (VITAMIN C PO) Take 1 tablet by mouth daily.   Yes [provider]  aspirin  EC 81 MG tablet Take 81 mg by mouth at bedtime.   Yes [provider]  Cholecalciferol (D3 PO) Take 1 tablet by mouth daily.   Yes [provider]  ezetimibe (ZETIA) 10 MG tablet Take 10 mg by mouth daily. 09/22/21  Yes [provider]  Ferrous Sulfate (IRON PO) Take 1 tablet by mouth daily.   Yes [provider]  lisinopril-hydrochlorothiazide (ZESTORETIC) 20-25 MG tablet Take 1 tablet by mouth daily.   Yes [provider]  Menaquinone-7 (K2 PO) Take 1 tablet by mouth daily.   Yes [provider]  nebivolol  (BYSTOLIC ) 10 MG tablet Take 10 mg by mouth daily.   Yes [provider]  REPATHA  SURECLICK 140 MG/ML SOAJ Inject 1 mL into the skin every 14 (fourteen) days.   Yes [provider]  rosuvastatin  (CRESTOR ) 20 MG tablet TAKE (1) TABLET DAILY AT BEDTIME. 06/15/20  Yes Hugh Madura, MD  Evolocumab  with Infusor (REPATHA  PUSHTRONEX SYSTEM) 420 MG/3.5ML SOCT Inject 420 mg into the skin every 30  (thirty) days. Patient not taking: Reported on 04/22/2024 05/28/23     lisinopril (ZESTRIL) 20 MG tablet Take 20 mg by mouth daily. Patient not taking: Reported on 04/22/2024    [provider]     Critical care time: 32 min    Lalla Pill, PA-C [facility] 04/25/24 12:43 PM  Please see [email or web address] for pager details.  From 7A-7P if no response, please call [phone] After hours, please call ELink [phone]

## 2024-04-26 ENCOUNTER — Inpatient Hospital Stay (HOSPITAL_COMMUNITY)

## 2024-04-26 LAB — BASIC METABOLIC PANEL WITH GFR
Anion gap: 6 (ref 5–15)
BUN: 14 mg/dL (ref 6–20)
CO2: 25 mmol/L (ref 22–32)
Calcium: 8.1 mg/dL — ABNORMAL LOW (ref 8.9–10.3)
Chloride: 102 mmol/L (ref 98–111)
Creatinine, Ser: 1.12 mg/dL (ref 0.61–1.24)
GFR, Estimated: >60 mL/min (ref >60)
Glucose, Bld: 111 mg/dL — ABNORMAL HIGH (ref 70–99)
Potassium: 3.8 mmol/L (ref 3.5–5.1)
Sodium: 133 mmol/L — ABNORMAL LOW (ref 135–145)

## 2024-04-26 LAB — GLUCOSE, CAPILLARY
Glucose-Capillary: 111 mg/dL — ABNORMAL HIGH (ref 70–99)
Glucose-Capillary: 136 mg/dL — ABNORMAL HIGH (ref 70–99)
Glucose-Capillary: 111 mg/dL — ABNORMAL HIGH (ref 70–99)
Glucose-Capillary: 120 mg/dL — ABNORMAL HIGH (ref 70–99)
Glucose-Capillary: 118 mg/dL — ABNORMAL HIGH (ref 70–99)

## 2024-04-26 LAB — CBC
HCT: 29 % — ABNORMAL LOW (ref 39.0–52.0)
Hemoglobin: 9.8 g/dL — ABNORMAL LOW (ref 13.0–17.0)
MCH: 33.8 pg (ref 26.0–34.0)
MCHC: 33.8 g/dL (ref 30.0–36.0)
MCV: 100 fL (ref 80.0–100.0)
Platelets: 192 10*3/uL (ref 150–400)
RBC: 2.9 MIL/uL — ABNORMAL LOW (ref 4.22–5.81)
RDW: 12.1 % (ref 11.5–15.5)
WBC: 11.2 10*3/uL — ABNORMAL HIGH (ref 4.0–10.5)
nRBC: 0 % (ref 0.0–0.2)

## 2024-04-26 MED ORDER — ROSUVASTATIN CALCIUM 20 MG PO TABS
20.0000 mg | ORAL_TABLET | Freq: Every day | ORAL | Status: DC
  Administered 2024-04-26 – 2024-04-28 (×3): 20 mg via ORAL
  Filled 2024-04-26 (×3): qty 1

## 2024-04-26 MED ORDER — ~~LOC~~ CARDIAC SURGERY, PATIENT & FAMILY EDUCATION: Freq: Once | Status: AC

## 2024-04-26 MED ORDER — SODIUM CHLORIDE 0.9% FLUSH: 3.0000 mL | INTRAVENOUS | Status: DC | PRN

## 2024-04-26 MED ORDER — INSULIN ASPART 100 UNIT/ML IJ SOLN: 0.0000 [IU] | Freq: Three times a day (TID) | INTRAMUSCULAR | Status: DC

## 2024-04-26 MED ORDER — ZOLPIDEM TARTRATE 5 MG PO TABS: 5.0000 mg | ORAL_TABLET | Freq: Every evening | ORAL | Status: DC | PRN

## 2024-04-26 MED ORDER — AMLODIPINE BESYLATE 5 MG PO TABS
5.0000 mg | ORAL_TABLET | Freq: Every day | ORAL | Status: DC
  Administered 2024-04-26 – 2024-04-28 (×3): 5 mg via ORAL
  Filled 2024-04-26 (×3): qty 1

## 2024-04-26 MED ORDER — SODIUM CHLORIDE 0.9 % IV SOLN: 250.0000 mL | INTRAVENOUS | Status: AC | PRN

## 2024-04-26 MED ORDER — EZETIMIBE 10 MG PO TABS
10.0000 mg | ORAL_TABLET | Freq: Every day | ORAL | Status: DC
  Administered 2024-04-26 – 2024-04-28 (×3): 10 mg via ORAL
  Filled 2024-04-26 (×3): qty 1

## 2024-04-26 MED ORDER — SODIUM CHLORIDE 0.9% FLUSH
3.0000 mL | Freq: Two times a day (BID) | INTRAVENOUS | Status: DC
  Administered 2024-04-26 – 2024-04-28 (×5): 3 mL via INTRAVENOUS

## 2024-04-26 MED ORDER — ALUM & MAG HYDROXIDE-SIMETH 200-200-20 MG/5ML PO SUSP: 15.0000 mL | Freq: Four times a day (QID) | ORAL | Status: DC | PRN

## 2024-04-26 MED ORDER — MAGNESIUM HYDROXIDE 400 MG/5ML PO SUSP
30.0000 mL | Freq: Every day | ORAL | Status: DC | PRN
Start: 2024-04-26 — End: 2024-04-28

## 2024-04-26 NOTE — Progress Notes (Signed)
 Secure chat with Donelda Fujita RN re IV consult.  Pt with PIV functioning WNL.  No need for 2nd PIV at this time with only PRN Zofran  IV ordered.  IV Consult completed.

## 2024-04-26 NOTE — Progress Notes (Signed)
 04/26/2024 Discussed with nurse, doing very well CXR a little wet, defer diuresis to Dr. Luna Salinas. Will follow peripherally this weekend unless issues.  Ardelle Kos MD PCCM

## 2024-04-26 NOTE — Progress Notes (Signed)
 2 Days Post-Op Procedure(s) (LRB): CORONARY ARTERY BYPASS GRAFTING (CABG) TIMES FOUR UTILIZING LEFT INTERNAL MAMMARY ARTERY, LEFT RADIAL ARTERY, AND RIGHT ENDOSCOPIC VEIN HARVEST OF GREAT SAPENOUS VEIN., (N/A) SURGICAL PROCUREMENT, ARTERY, RADIAL (Left) ECHOCARDIOGRAM, TRANSESOPHAGEAL, INTRAOPERATIVE (N/A) Subjective: + incisional pain, had oxycodone  earlier  Objective: Vital signs in last 24 hours: Temp:  [97.8 F (36.6 C)-99.1 F (37.3 C)] 98.3 F (36.8 C) (05/31 0319) Pulse Rate:  [72-93] 84 (05/31 0700) Cardiac Rhythm: Normal sinus rhythm (05/30 2100) Resp:  [5-20] 10 (05/31 0700) BP: (86-129)/(57-80) 109/67 (05/31 0700) SpO2:  [93 %-100 %] 93 % (05/31 0700) Arterial Line BP: (95-124)/(46-56) 109/46 (05/30 1215) Weight:  [70.6 kg] 70.6 kg (05/31 0500)  Hemodynamic parameters for last 24 hours: CVP:  [1 mmHg-13 mmHg] 1 mmHg CO:  [8 L/min-9 L/min] 9 L/min CI:  [4.2 L/min/m2-5 L/min/m2] 5 L/min/m2  Intake/Output from previous day: 05/30 0701 - 05/31 0700 In: 999.3 [P.O.:350; I.V.:249.6; IV Piggyback:399.7] Out: 185 [Urine:65; Chest Tube:120] Intake/Output this shift: No intake/output data recorded.  General appearance: alert, cooperative, and no distress Neurologic: intact Heart: regular rate and rhythm and + rub Lungs: diminished breath sounds bibasilar Abdomen: normal findings: soft, non-tender  Lab Results: Recent Labs    04/25/24 1549 04/26/24 0517  WBC 8.9 11.2*  HGB 9.2* 9.8*  HCT 27.8* 29.0*  PLT 181 192   BMET:  Recent Labs    04/25/24 1549 04/26/24 0517  NA 135 133*  K 3.8 3.8  CL 99 102  CO2 26 25  GLUCOSE 158* 111*  BUN 13 14  CREATININE 1.12 1.12  CALCIUM  8.5* 8.1*    PT/INR:  Recent Labs    04/24/24 1653  LABPROT 17.0*  INR 1.4*   ABG    Component Value Date/Time   PHART 7.327 (L) 04/24/2024 1803   HCO3 24.5 04/24/2024 1803   TCO2 26 04/24/2024 1803   ACIDBASEDEF 2.0 04/24/2024 1803   O2SAT 98 04/24/2024 1803   CBG (last 3)   Recent Labs    04/25/24 1942 04/25/24 2306 04/26/24 0318  GLUCAP 109* 138* 111*    Assessment/Plan: S/P Procedure(s) (LRB): CORONARY ARTERY BYPASS GRAFTING (CABG) TIMES FOUR UTILIZING LEFT INTERNAL MAMMARY ARTERY, LEFT RADIAL ARTERY, AND RIGHT ENDOSCOPIC VEIN HARVEST OF GREAT SAPENOUS VEIN., (N/A) SURGICAL PROCUREMENT, ARTERY, RADIAL (Left) ECHOCARDIOGRAM, TRANSESOPHAGEAL, INTRAOPERATIVE (N/A) POD # 2 CABG NEURO- intact CV- in SR, BP well controlled  ASA  Statin  Norvasc  for radial graft RESP- atelectasis still present but improved  Continue IS RENAL- creatinine and lytes Ok ENDO- CBG mildly elevated  Change SSI to AC and HS Gi- tolerating diet SCD + enoxaparin  Cardiac rehab Transfer to 4E   LOS: 2 days    Zelphia Higashi 04/26/2024

## 2024-04-26 NOTE — Evaluation (Signed)
 Physical Therapy Evaluation Patient Details Name: Gary Castaneda MRN: [id number] DOB: May 17, 1971 Today's Date: 04/26/2024  History of Present Illness  53 y.o. male presents to [facility] 04/24/24 for CABGx4 using R radial artery and R great saphenous vein. PMHx: HTN, vasovagal episodes, 3VCAD  Clinical Impression  Pt in bed upon arrival with wife present and agreeable to PT eval. PTA, pt was independent for mobility with no AD and working full-time. In today's session, pt required ModA for bed mobility and supervision to stand and ambulate 100 ft with no AD. Discussed using a RW for community mobility and for energy conservation with pt agreement. Pt has 24/7 physical assist available upon return home. Recommending cardiac rehab upon d/c. Pt would benefit from acute skilled PT with current functional limitations listed below (see PT Problem List). Acute PT to follow.  BP 111/72, 82 BPM 94% SpO2 on RA at rest 89-92% SpO2 on RA during gait        If plan is discharge home, recommend the following: A lot of help with walking and/or transfers;A lot of help with bathing/dressing/bathroom;Assistance with cooking/housework;Assist for transportation;Help with stairs or ramp for entrance   Can travel by private vehicle    Yes    Equipment Recommendations Rolling walker (2 wheels)     Functional Status Assessment Patient has had a recent decline in their functional status and demonstrates the ability to make significant improvements in function in a reasonable and predictable amount of time.     Precautions / Restrictions Precautions Precautions: Fall;Sternal Precaution Booklet Issued: Yes (comment) Recall of Precautions/Restrictions: Impaired Precaution/Restrictions Comments: Watch O2 Restrictions Other Position/Activity Restrictions: sternal      Mobility  Bed Mobility Overal bed mobility: Needs Assistance Bed Mobility: Rolling, Sidelying to Sit, Sit to Sidelying Rolling: Mod  assist Sidelying to sit: Contact guard assist    Sit to sidelying: Mod assist General bed mobility comments: ModA to roll towards the left. Pt able to raise trunk with no assist. ModA for return to sidelying for LE management with increased difficulty staying in sidelying    Transfers Overall transfer level: Needs assistance Equipment used: None Transfers: Sit to/from Stand Sit to Stand: Supervision    General transfer comment: supervison for safety with pt preferring to hold heart pillow    Ambulation/Gait Ambulation/Gait assistance: Supervision Gait Distance (Feet): 100 Feet Assistive device: None Gait Pattern/deviations: Step-through pattern, Decreased stride length Gait velocity: decr     General Gait Details: decreased gait speed with shortened steps. Steady with no LOB. Pt preferring to hold onto heart pillow for comfort    Balance Overall balance assessment: Needs assistance Sitting-balance support: No upper extremity supported, Feet supported Sitting balance-Leahy Scale: Good     Standing balance support: No upper extremity supported Standing balance-Leahy Scale: Good       Pertinent Vitals/Pain Pain Assessment Pain Assessment: Faces Faces Pain Scale: Hurts little more Pain Location: sternal incision, throat Pain Descriptors / Indicators: Aching, Discomfort Pain Intervention(s): Limited activity within patient's tolerance, Monitored during session, Repositioned    Home Living Family/patient expects to be discharged to:: Private residence Living Arrangements: Spouse/significant other;Children (12 y.o. w/ autism, 25 y.o. twins) Available Help at Discharge: Family;Available 24 hours/day Type of Home: House Home Access: Stairs to enter Entrance Stairs-Rails: None Entrance Stairs-Number of Steps: 4 (from garage, 9 steps from front with B hand rails) Alternate Level Stairs-Number of Steps: 14 Home Layout: Two level;Bed/bath upstairs Home Equipment: Shower  seat Additional Comments: Could stay on ground floor in  recliner, full bath upstairs    Prior Function Prior Level of Function : Driving;Working/employed    Mobility Comments: ind w/ no AD ADLs Comments: works as a Therapist, music Extremity Assessment: Defer to OT evaluation    Lower Extremity Assessment Lower Extremity Assessment: Overall WFL for tasks assessed    Cervical / Trunk Assessment Cervical / Trunk Assessment: Normal  Communication   Communication Communication: No apparent difficulties    Cognition Arousal: Alert Behavior During Therapy: WFL for tasks assessed/performed   PT - Cognitive impairments: No apparent impairments    Following commands: Intact       Cueing Cueing Techniques: Verbal cues     General Comments General comments (skin integrity, edema, etc.): Wife present and supportive during session     PT Assessment Patient needs continued PT services  PT Problem List Decreased activity tolerance;Decreased balance;Decreased mobility;Decreased knowledge of precautions;Cardiopulmonary status limiting activity       PT Treatment Interventions DME instruction;Gait training;Functional mobility training;Stair training;Therapeutic activities;Therapeutic exercise;Balance training;Neuromuscular re-education;Patient/family education    PT Goals (Current goals can be found in the Care Plan section)  Acute Rehab PT Goals Patient Stated Goal: to not need help getting out of bed PT Goal Formulation: With patient/family Time For Goal Achievement: 05/10/24 Potential to Achieve Goals: Good    Frequency Min 2X/week        AM-PAC PT "6 Clicks" Mobility  Outcome Measure Help needed turning from your back to your side while in a flat bed without using bedrails?: A Lot Help needed moving from lying on your back to sitting on the side of a flat bed without using bedrails?: A Lot Help  needed moving to and from a bed to a chair (including a wheelchair)?: A Little Help needed standing up from a chair using your arms (e.g., wheelchair or bedside chair)?: A Little Help needed to walk in hospital room?: A Little Help needed climbing 3-5 steps with a railing? : A Little 6 Click Score: 16    End of Session   Activity Tolerance: Patient tolerated treatment well Patient left: in bed;with call bell/phone within reach;with family/visitor present Nurse Communication: Mobility status PT Visit Diagnosis: Other abnormalities of gait and mobility (R26.89)    Time: 7846-9629 PT Time Calculation (min) (ACUTE ONLY): 41 min   Charges:   PT Evaluation $PT Eval Low Complexity: 1 Low PT Treatments $Gait Training: 8-22 mins $Therapeutic Activity: 8-22 mins PT General Charges $$ ACUTE PT VISIT: 1 Visit        Orysia Blas, PT, DPT Secure Chat Preferred  Rehab Office [phone]   Alissa April Adela Ades 04/26/2024, 3:43 PM

## 2024-04-26 NOTE — Plan of Care (Signed)
   Problem: Activity: Goal: Risk for activity intolerance will decrease Outcome: Progressing   Problem: Nutrition: Goal: Adequate nutrition will be maintained Outcome: Progressing   Problem: Coping: Goal: Level of anxiety will decrease Outcome: Progressing

## 2024-04-27 ENCOUNTER — Inpatient Hospital Stay (HOSPITAL_COMMUNITY)

## 2024-04-27 MED ORDER — FUROSEMIDE 40 MG PO TABS
40.0000 mg | ORAL_TABLET | Freq: Every day | ORAL | Status: DC
  Administered 2024-04-27: 40 mg via ORAL
  Filled 2024-04-27: qty 1

## 2024-04-27 MED ORDER — PHENOL 1.4 % MT LIQD: 1.0000 | OROMUCOSAL | Status: DC | PRN

## 2024-04-27 MED ORDER — POTASSIUM CHLORIDE CRYS ER 20 MEQ PO TBCR
20.0000 meq | EXTENDED_RELEASE_TABLET | Freq: Every day | ORAL | Status: DC
  Administered 2024-04-27: 20 meq via ORAL
  Filled 2024-04-27: qty 1

## 2024-04-27 MED ORDER — LACTULOSE 10 GM/15ML PO SOLN: 20.0000 g | Freq: Every day | ORAL | Status: DC | PRN

## 2024-04-27 NOTE — Progress Notes (Signed)
      [address]       [place] [address]             [phone]      3 Days Post-Op Procedure(s) (LRB): CORONARY ARTERY BYPASS GRAFTING (CABG) TIMES FOUR UTILIZING LEFT INTERNAL MAMMARY ARTERY, LEFT RADIAL ARTERY, AND RIGHT ENDOSCOPIC VEIN HARVEST OF GREAT SAPENOUS VEIN., (N/A) SURGICAL PROCUREMENT, ARTERY, RADIAL (Left) ECHOCARDIOGRAM, TRANSESOPHAGEAL, INTRAOPERATIVE (N/A) Subjective: Patient states he feels much better than yesterday but his throat hurts from the tube.   Objective: Vital signs in last 24 hours: Temp:  [97.7 F (36.5 C)-99.1 F (37.3 C)] 98.7 F (37.1 C) (06/01 0607) Pulse Rate:  [77-89] 87 (06/01 0607) Cardiac Rhythm: Normal sinus rhythm (05/31 1900) Resp:  [7-23] 16 (06/01 0607) BP: (107-136)/(65-79) 116/76 (06/01 0607) SpO2:  [91 %-96 %] 96 % (06/01 0019) FiO2 (%):  [21 %] 21 % (05/31 0853) Weight:  [68.2 kg] 68.2 kg (06/01 0552)  Hemodynamic parameters for last 24 hours:    Intake/Output from previous day: 05/31 0701 - 06/01 0700 In: 120 [P.O.:120] Out: 750 [Urine:750] Intake/Output this shift: No intake/output data recorded.  General appearance: alert, cooperative, and no distress Neurologic: intact Heart: regular rate and rhythm, S1, S2 normal, no murmur, click, rub or gallop Lungs: slightly diminished bibasilar breath sounds Abdomen: soft, non-tender; bowel sounds normal; no masses,  no organomegaly Extremities: edema trace-1+ BLE Wound: Clean and dry without sign of infection  Lab Results: Recent Labs    04/25/24 1549 04/26/24 0517  WBC 8.9 11.2*  HGB 9.2* 9.8*  HCT 27.8* 29.0*  PLT 181 192   BMET:  Recent Labs    04/25/24 1549 04/26/24 0517  NA 135 133*  K 3.8 3.8  CL 99 102  CO2 26 25  GLUCOSE 158* 111*  BUN 13 14  CREATININE 1.12 1.12  CALCIUM  8.5* 8.1*    PT/INR:  Recent Labs    04/24/24 1653  LABPROT 17.0*  INR 1.4*   ABG    Component Value Date/Time   PHART 7.327 (L) 04/24/2024 1803    HCO3 24.5 04/24/2024 1803   TCO2 26 04/24/2024 1803   ACIDBASEDEF 2.0 04/24/2024 1803   O2SAT 98 04/24/2024 1803   CBG (last 3)  Recent Labs    04/26/24 1127 04/26/24 1518 04/26/24 2105  GLUCAP 111* 120* 118*    Assessment/Plan: S/P Procedure(s) (LRB): CORONARY ARTERY BYPASS GRAFTING (CABG) TIMES FOUR UTILIZING LEFT INTERNAL MAMMARY ARTERY, LEFT RADIAL ARTERY, AND RIGHT ENDOSCOPIC VEIN HARVEST OF GREAT SAPENOUS VEIN., (N/A) SURGICAL PROCUREMENT, ARTERY, RADIAL (Left) ECHOCARDIOGRAM, TRANSESOPHAGEAL, INTRAOPERATIVE (N/A)  CV: BP controlled on Lopressor  12.5mg  BID, and Norvasc  5mg  daily for radial artery conduit. NSR, HR 80s.   Pulm: Saturating well on RA this AM. CXR with bibasilar atelectasis. Encourage IS and ambulation.  GI: -BM, passing flatus. Has no eaten or ambulated much. Will add lactulose prn. No nausea or vomiting.   Endo: Preop A1C 4.9, no hx of DM. Will d/c SSI and CBGs  Renal: Cr 1.12 yesterday, stable. UO 750cc/24hrs. Under preop weight but edematous on exam. Will continue PO Lasix 40mg  daily. K 3.8, supplement.   ID: Likely reactive leukocytosis, last WBC 11.2. Tmax 99.1, clinically monitor.   Expected postop ABLA: H/H 9.8/29 yesterday, stable. Not clinically significant at this time.   DVT Prophylaxis: Lovenox   Dispo: Hopefully can d/c home next 48 hours   LOS: 3 days    Randa Burton, PA-C 04/27/2024

## 2024-04-27 NOTE — Plan of Care (Signed)

## 2024-04-27 NOTE — Evaluation (Signed)
 Occupational Therapy Evaluation Patient Details Name: Gary Castaneda MRN: [id number] DOB: [birth year] Today's Date: 04/27/2024   History of Present Illness   53 y.o. male presents to [facility] 04/24/24 for CABGx4 using R radial artery and R great saphenous vein. PMHx: HTN, vasovagal episodes, 3VCAD     Clinical Impressions Pt reports ind at baseline with ADLs/functional mobility, lives with family who can assist at d/c. Pt currently needing up to mod A for ADLs, mod A for bed mobility and CGA for transfers without AD. Pt educated on sternal precautions and began education on compensatory strategies for ADLs (already has sternal prec handout), spouse plans to bring clothes for pt to practice with tomorrow morning. Also discussed DME (toilet riser, shower chair, and long handle sponge for home). Pt presenting with impairments listed below, will follow acutely. Recommend OP Cardiac rehab when cleared by MD.     If plan is discharge home, recommend the following:   A little help with walking and/or transfers;A little help with bathing/dressing/bathroom;Assistance with cooking/housework;Assist for transportation;Help with stairs or ramp for entrance     Functional Status Assessment   Patient has had a recent decline in their functional status and demonstrates the ability to make significant improvements in function in a reasonable and predictable amount of time.     Equipment Recommendations   Other (comment) (discussed purchasing of toilet riser as this would fit better in their bathroom)     Recommendations for Other Services   PT consult     Precautions/Restrictions   Precautions Precautions: Fall;Sternal     Mobility Bed Mobility Overal bed mobility: Needs Assistance Bed Mobility: Rolling, Sidelying to Sit, Sit to Sidelying Rolling: Mod assist       Sit to sidelying: Mod assist General bed mobility comments: for BLE and trunk elevation    Transfers Overall transfer  level: Needs assistance Equipment used: None Transfers: Sit to/from Stand Sit to Stand: Contact guard assist                  Balance Overall balance assessment: Needs assistance Sitting-balance support: No upper extremity supported, Feet supported Sitting balance-Leahy Scale: Good     Standing balance support: No upper extremity supported Standing balance-Leahy Scale: Good                             ADL either performed or assessed with clinical judgement   ADL Overall ADL's : Needs assistance/impaired Eating/Feeding: Set up;Sitting   Grooming: Set up;Standing   Upper Body Bathing: Moderate assistance;Sitting   Lower Body Bathing: Moderate assistance;Sitting/lateral leans   Upper Body Dressing : Moderate assistance;Sitting   Lower Body Dressing: Moderate assistance;Sitting/lateral leans   Toilet Transfer: Contact guard assist;Ambulation   Toileting- Clothing Manipulation and Hygiene: Minimal assistance       Functional mobility during ADLs: Contact guard assist       Vision   Vision Assessment?: No apparent visual deficits     Perception Perception: Not tested       Praxis Praxis: Not tested       Pertinent Vitals/Pain Pain Assessment Pain Assessment: No/denies pain     Extremity/Trunk Assessment Upper Extremity Assessment Upper Extremity Assessment: Generalized weakness   Lower Extremity Assessment Lower Extremity Assessment: Defer to PT evaluation   Cervical / Trunk Assessment Cervical / Trunk Assessment: Normal   Communication Communication Communication: No apparent difficulties   Cognition Arousal: Alert Behavior During Therapy: WFL for tasks assessed/performed Cognition:  No apparent impairments                               Following commands: Intact       Cueing  General Comments   Cueing Techniques: Verbal cues  VSS   Exercises     Shoulder Instructions      Home Living Family/patient  expects to be discharged to:: Private residence Living Arrangements: Spouse/significant other;Children Available Help at Discharge: Family;Available 24 hours/day Type of Home: House Home Access: Stairs to enter   Entrance Stairs-Rails: None Home Layout: Two level;Bed/bath upstairs Alternate Level Stairs-Number of Steps: 14 Alternate Level Stairs-Rails: Left Bathroom Shower/Tub: Chief Strategy Officer: Handicapped height (will fit RW on 2nd floor, not on first) Bathroom Accessibility: Yes   Home Equipment: Shower seat   Additional Comments: Could stay on ground floor in recliner, full bath upstairs      Prior Functioning/Environment Prior Level of Function : Driving;Working/employed             Mobility Comments: ind w/ no AD ADLs Comments: works as a Occupational hygienist Problem List: Decreased knowledge of precautions;Impaired balance (sitting and/or standing);Decreased range of motion;Decreased activity tolerance;Decreased strength;Cardiopulmonary status limiting activity   OT Treatment/Interventions: Self-care/ADL training;Therapeutic exercise;Energy conservation;DME and/or AE instruction;Therapeutic activities;Patient/family education;Balance training      OT Goals(Current goals can be found in the care plan section)   Acute Rehab OT Goals Patient Stated Goal: none stated OT Goal Formulation: With patient Time For Goal Achievement: 05/11/24 Potential to Achieve Goals: Good ADL Goals Pt Will Perform Upper Body Dressing: with modified independence;sitting Pt Will Perform Lower Body Dressing: with modified independence;sitting/lateral leans;sit to/from stand Pt Will Transfer to Toilet: with modified independence;ambulating;regular height toilet Pt Will Perform Tub/Shower Transfer: Tub transfer;Shower transfer;with modified independence;ambulating;shower seat   OT Frequency:  Min 2X/week    Co-evaluation              AM-PAC OT "6 Clicks"  Daily Activity     Outcome Measure Help from another person eating meals?: A Little Help from another person taking care of personal grooming?: A Little Help from another person toileting, which includes using toliet, bedpan, or urinal?: A Little Help from another person bathing (including washing, rinsing, drying)?: A Lot Help from another person to put on and taking off regular upper body clothing?: A Little Help from another person to put on and taking off regular lower body clothing?: A Lot 6 Click Score: 16   End of Session Nurse Communication: Mobility status  Activity Tolerance: Patient tolerated treatment well Patient left: in bed;with bed alarm set;with call bell/phone within reach;with family/visitor present  OT Visit Diagnosis: Unsteadiness on feet (R26.81);Other abnormalities of gait and mobility (R26.89);Muscle weakness (generalized) (M62.81)                Time: 1610-9604 OT Time Calculation (min): 38 min Charges:  OT General Charges $OT Visit: 1 Visit OT Evaluation $OT Eval Moderate Complexity: 1 Mod OT Treatments $Self Care/Home Management : 8-22 mins $Therapeutic Activity: 8-22 mins  Khayla Koppenhaver K, OTD, OTR/L SecureChat Preferred Acute Rehab [phone]   Benedict Brain Koonce 04/27/2024, 12:19 PM

## 2024-04-28 ENCOUNTER — Other Ambulatory Visit (HOSPITAL_COMMUNITY): Payer: Self-pay

## 2024-04-28 LAB — BASIC METABOLIC PANEL WITH GFR
Anion gap: 12 (ref 5–15)
BUN: 16 mg/dL (ref 6–20)
CO2: 22 mmol/L (ref 22–32)
Calcium: 8.2 mg/dL — ABNORMAL LOW (ref 8.9–10.3)
Chloride: 102 mmol/L (ref 98–111)
Creatinine, Ser: 0.87 mg/dL (ref 0.61–1.24)
GFR, Estimated: >60 mL/min (ref >60)
Glucose, Bld: 88 mg/dL (ref 70–99)
Potassium: 3.4 mmol/L — ABNORMAL LOW (ref 3.5–5.1)
Sodium: 136 mmol/L (ref 135–145)

## 2024-04-28 LAB — CBC
HCT: 27.2 % — ABNORMAL LOW (ref 39.0–52.0)
Hemoglobin: 9.1 g/dL — ABNORMAL LOW (ref 13.0–17.0)
MCH: 33.1 pg (ref 26.0–34.0)
MCHC: 33.5 g/dL (ref 30.0–36.0)
MCV: 98.9 fL (ref 80.0–100.0)
Platelets: 220 10*3/uL (ref 150–400)
RBC: 2.75 MIL/uL — ABNORMAL LOW (ref 4.22–5.81)
RDW: 11.8 % (ref 11.5–15.5)
WBC: 6 10*3/uL (ref 4.0–10.5)
nRBC: 0 % (ref 0.0–0.2)

## 2024-04-28 MED ORDER — ASPIRIN 325 MG PO TBEC
325.0000 mg | DELAYED_RELEASE_TABLET | Freq: Every day | ORAL | 1 refills | Status: DC
  Filled 2024-04-28 (×2): qty 30, 30d supply, fill #0

## 2024-04-28 MED ORDER — POTASSIUM CHLORIDE CRYS ER 20 MEQ PO TBCR
20.0000 meq | EXTENDED_RELEASE_TABLET | Freq: Three times a day (TID) | ORAL | Status: DC
  Administered 2024-04-28: 20 meq via ORAL
  Filled 2024-04-28: qty 1

## 2024-04-28 MED ORDER — METOPROLOL TARTRATE 25 MG PO TABS
12.5000 mg | ORAL_TABLET | Freq: Two times a day (BID) | ORAL | 1 refills | Status: DC
  Filled 2024-04-28: qty 60, 60d supply, fill #0

## 2024-04-28 MED ORDER — POTASSIUM CHLORIDE CRYS ER 20 MEQ PO TBCR
20.0000 meq | EXTENDED_RELEASE_TABLET | Freq: Two times a day (BID) | ORAL | 0 refills | Status: AC
Start: 2024-04-28 — End: 2024-06-03
  Filled 2024-04-28: qty 2, 1d supply, fill #0

## 2024-04-28 MED ORDER — AMLODIPINE BESYLATE 5 MG PO TABS
5.0000 mg | ORAL_TABLET | Freq: Every day | ORAL | 1 refills | Status: DC
  Filled 2024-04-28: qty 30, 30d supply, fill #0

## 2024-04-28 MED ORDER — OXYCODONE HCL 5 MG PO TABS
5.0000 mg | ORAL_TABLET | Freq: Four times a day (QID) | ORAL | 0 refills | Status: AC | PRN
  Filled 2024-04-28: qty 28, 7d supply, fill #0

## 2024-04-28 MED FILL — Mannitol IV Soln 20%: INTRAVENOUS | Qty: 500 | Status: AC

## 2024-04-28 MED FILL — Heparin Sodium (Porcine) Inj 1000 Unit/ML: INTRAMUSCULAR | Qty: 10 | Status: AC

## 2024-04-28 MED FILL — Calcium Chloride Inj 10%: INTRAVENOUS | Qty: 10 | Status: AC

## 2024-04-28 MED FILL — Electrolyte-R (PH 7.4) Solution: INTRAVENOUS | Qty: 4000 | Status: AC

## 2024-04-28 MED FILL — Sodium Chloride IV Soln 0.9%: INTRAVENOUS | Qty: 2000 | Status: AC

## 2024-04-28 NOTE — Progress Notes (Signed)
 Mobility Specialist Progress Note:    04/28/24 1043  Mobility  Activity Ambulated with assistance in room;Ambulated with assistance in hallway  Level of Assistance Standby assist, set-up cues, supervision of patient - no hands on  Assistive Device Front wheel walker  Distance Ambulated (ft) 400 ft  RUE Weight Bearing Per Provider Order NWB  LUE Weight Bearing Per Provider Order NWB  Activity Response Tolerated well  Mobility Referral Yes  Mobility visit 1 Mobility  Mobility Specialist Start Time (ACUTE ONLY) 1034  Mobility Specialist Stop Time (ACUTE ONLY) 1041  Mobility Specialist Time Calculation (min) (ACUTE ONLY) 7 min   Pt received in chair, wife in room. Agreeable to mobility session. Ambulated in hallway with RW and SV. Tolerated well, Max HR 98 bpm. Returned pt to room, left with all needs met. RN notified.   Jackolyn Geron Mobility Specialist Please contact via Special educational needs teacher or  Rehab office at [phone]

## 2024-04-28 NOTE — Plan of Care (Signed)

## 2024-04-28 NOTE — Progress Notes (Signed)
 4 Days Post-Op Procedure(s) (LRB): CORONARY ARTERY BYPASS GRAFTING (CABG) TIMES FOUR UTILIZING LEFT INTERNAL MAMMARY ARTERY, LEFT RADIAL ARTERY, AND RIGHT ENDOSCOPIC VEIN HARVEST OF GREAT SAPENOUS VEIN., (N/A) SURGICAL PROCUREMENT, ARTERY, RADIAL (Left) ECHOCARDIOGRAM, TRANSESOPHAGEAL, INTRAOPERATIVE (N/A) Subjective: Up in the bedside chair. Feels he is progressing. No complaints or concerns. Walked in the hall several times yesterday. Passing gas, no BM yet.   Objective: Vital signs in last 24 hours: Temp:  [98.4 F (36.9 C)-99.3 F (37.4 C)] 98.4 F (36.9 C) (06/02 0424) Pulse Rate:  [88-96] 96 (06/02 0424) Cardiac Rhythm: Normal sinus rhythm (06/01 1904) Resp:  [13-23] 23 (06/02 0424) BP: (113-133)/(60-84) 133/60 (06/02 0424) SpO2:  [95 %-99 %] 99 % (06/02 0424) Weight:  [68.1 kg] 68.1 kg (06/02 0500)    Intake/Output from previous day: 06/01 0701 - 06/02 0700 In: 60 [P.O.:60] Out: -  Intake/Output this shift: No intake/output data recorded.  General appearance: alert, cooperative, and no distress Neurologic: intact Heart: RRR Lungs: normal work of breathing on RA. Clear breath sounds Abdomen: soft, no tenderness Extremities: well perfused, no peripheral edema. The LUE incision is intact and dry. Wound: the sternotomy incision is intact and dry.  Lab Results: Recent Labs    04/26/24 0517 04/28/24 0427  WBC 11.2* 6.0  HGB 9.8* 9.1*  HCT 29.0* 27.2*  PLT 192 220   BMET:  Recent Labs    04/26/24 0517 04/28/24 0427  NA 133* 136  K 3.8 3.4*  CL 102 102  CO2 25 22  GLUCOSE 111* 88  BUN 14 16  CREATININE 1.12 0.87  CALCIUM  8.1* 8.2*    PT/INR: No results for input(s): "LABPROT", "INR" in the last 72 hours. ABG    Component Value Date/Time   PHART 7.327 (L) 04/24/2024 1803   HCO3 24.5 04/24/2024 1803   TCO2 26 04/24/2024 1803   ACIDBASEDEF 2.0 04/24/2024 1803   O2SAT 98 04/24/2024 1803   CBG (last 3)  Recent Labs    04/26/24 1127 04/26/24 1518  04/26/24 2105  GLUCAP 111* 120* 118*    Assessment/Plan: S/P Procedure(s) (LRB): CORONARY ARTERY BYPASS GRAFTING (CABG) TIMES FOUR UTILIZING LEFT INTERNAL MAMMARY ARTERY, LEFT RADIAL ARTERY, AND RIGHT ENDOSCOPIC VEIN HARVEST OF GREAT SAPENOUS VEIN., (N/A) SURGICAL PROCUREMENT, ARTERY, RADIAL (Left) ECHOCARDIOGRAM, TRANSESOPHAGEAL, INTRAOPERATIVE (N/A) -POD 4 CABG x 4, normal EF.  Progressing well.  On ASA, amlodipine  (for radial graft),  low-dose metoprolol , rosuvastatin .   -HEME- expected acute blood loss anemia. Tolerating well, no intervention indicated.  -GI- tolerating diet. No BM yet. Bowel regimen.   -RENAL- normal function. Wt below pre-op, K+ 3.4, will replace K+ and stop diuretic.   -DVT PPX- on daily enoxaparin , ambulating.   -Disposition-anticipate discharge later today or in AM.     LOS: 4 days    Eilah Common G. Callum Wolf, PA-C 04/28/2024

## 2024-04-28 NOTE — Anesthesia Postprocedure Evaluation (Signed)
 Anesthesia Post Note  Patient: RANDEL HARGENS  Procedure(s) Performed: CORONARY ARTERY BYPASS GRAFTING (CABG) TIMES FOUR UTILIZING LEFT INTERNAL MAMMARY ARTERY, LEFT RADIAL ARTERY, AND RIGHT ENDOSCOPIC VEIN HARVEST OF GREAT SAPENOUS VEIN., (Chest) SURGICAL PROCUREMENT, ARTERY, RADIAL (Left: Arm Lower) ECHOCARDIOGRAM, TRANSESOPHAGEAL, INTRAOPERATIVE     Patient location during evaluation: SICU Anesthesia Type: General Level of consciousness: sedated Pain management: pain level controlled Vital Signs Assessment: post-procedure vital signs reviewed and stable Respiratory status: patient remains intubated per anesthesia plan Cardiovascular status: stable Postop Assessment: no apparent nausea or vomiting Anesthetic complications: no   No notable events documented.               Erisa Mehlman

## 2024-04-28 NOTE — Progress Notes (Signed)
 Occupational Therapy Treatment Patient Details Name: Gary Castaneda MRN: [id number] DOB: 11-30-1970 Today's Date: 04/28/2024   History of present illness 53 y.o. male presents to [facility] 04/24/24 for CABGx4 using R radial artery and R great saphenous vein. PMHx: HTN, vasovagal episodes, 3VCAD   OT comments  Patient seen with wife present to address education on dressing while maintaining precautions. Patient instructed on donning boxers and elastic waistband pants with min assist. Patient unable to doff socks and was educated on use of reacher to doff socks and sock aide to Gary. Patient wife states they will purchase to also address compression stocking. Education provided on sock aide options that may benefit more with compression stocking. Education on use of LH shoehorn to assist with donning shoes. Patient declined donning T-shirt till closer time to go home, so education provided on staying in the "tube" to Gary. Further education provided on safety with shower transfers and possible need for shower seat for increased safety. Discharge recommendations continue to be appropriate.       If plan is discharge home, recommend the following:  A little help with walking and/or transfers;A little help with bathing/dressing/bathroom;Assistance with cooking/housework;Assist for transportation;Help with stairs or ramp for entrance   Equipment Recommendations  Tub/shower seat (toilet riser recommended)    Recommendations for Other Services      Precautions / Restrictions Precautions Precautions: Fall;Sternal Precaution Booklet Issued: Yes (comment) Recall of Precautions/Restrictions: Impaired Precaution/Restrictions Comments: Watch O2 Restrictions Weight Bearing Restrictions Per Provider Order: Yes RUE Weight Bearing Per Provider Order: Non weight bearing LUE Weight Bearing Per Provider Order: Non weight bearing Other Position/Activity Restrictions: sternal       Mobility Bed Mobility Overal  bed mobility: Needs Assistance             General bed mobility comments: OOB in recliner    Transfers Overall transfer level: Needs assistance Equipment used: None Transfers: Sit to/from Stand Sit to Stand: Supervision, Contact guard assist           General transfer comment: CGA for first stand and patient able to perform with supervision all other stands during self care     Balance Overall balance assessment: Needs assistance Sitting-balance support: No upper extremity supported, Feet supported Sitting balance-Leahy Scale: Good     Standing balance support: No upper extremity supported Standing balance-Leahy Scale: Good                             ADL either performed or assessed with clinical judgement   ADL Overall ADL's : Needs assistance/impaired                 Upper Body Dressing : Minimal assistance;Standing Upper Body Dressing Details (indicate cue type and reason): with gown, patient declined donning T-shirt till close to going home due to leads. Patient and wife instructed on donning T-shirt and maintaining sternal precautions Lower Body Dressing: Minimal assistance;With adaptive equipment;Sit to/from stand Lower Body Dressing Details (indicate cue type and reason): education on LB dressing to maintain Sternal precautions. AE education provided for reacher, sock aide and LH shoehorn with patient demonstrating good understanding and min assist.               General ADL Comments: focused on patient and family education for dressing    Extremity/Trunk Assessment Upper Extremity Assessment Upper Extremity Assessment: Generalized weakness            Vision  Perception     Praxis     Communication Communication Communication: No apparent difficulties   Cognition Arousal: Alert Behavior During Therapy: WFL for tasks assessed/performed Cognition: No apparent impairments                                Following commands: Intact        Cueing   Cueing Techniques: Verbal cues  Exercises      Shoulder Instructions       General Comments VSS on RA    Pertinent Vitals/ Pain       Pain Assessment Pain Assessment: No/denies pain Pain Intervention(s): Monitored during session  Home Living                                          Prior Functioning/Environment              Frequency  Min 2X/week        Progress Toward Goals  OT Goals(current goals can now be found in the care plan section)  Progress towards OT goals: Progressing toward goals  Acute Rehab OT Goals Patient Stated Goal: to go home OT Goal Formulation: With patient/family Time For Goal Achievement: 05/11/24 Potential to Achieve Goals: Good ADL Goals Pt Will Perform Upper Body Dressing: with modified independence;sitting Pt Will Perform Lower Body Dressing: with modified independence;sitting/lateral leans;sit to/from stand Pt Will Transfer to Toilet: with modified independence;ambulating;regular height toilet Pt Will Perform Tub/Shower Transfer: Tub transfer;Shower transfer;with modified independence;ambulating;shower seat  Plan      Co-evaluation                 AM-PAC OT "6 Clicks" Daily Activity     Outcome Measure   Help from another person eating meals?: A Little Help from another person taking care of personal grooming?: A Little Help from another person toileting, which includes using toliet, bedpan, or urinal?: A Little Help from another person bathing (including washing, rinsing, drying)?: A Lot Help from another person to put on and taking off regular upper body clothing?: A Little Help from another person to put on and taking off regular lower body clothing?: A Lot 6 Click Score: 16    End of Session Equipment Utilized During Treatment: Other (comment) (AE)  OT Visit Diagnosis: Unsteadiness on feet (R26.81);Other abnormalities of gait and mobility  (R26.89);Muscle weakness (generalized) (M62.81)   Activity Tolerance Patient tolerated treatment well   Patient Left in chair;with call bell/phone within reach;with family/visitor present   Nurse Communication Mobility status        Time: 1610-9604 OT Time Calculation (min): 33 min  Charges: OT General Charges $OT Visit: 1 Visit OT Treatments $Self Care/Home Management : 23-37 mins  Anitra Barn, OTA Acute Rehabilitation Services  Office [phone]   Jovita Nipper 04/28/2024, 12:41 PM

## 2024-04-28 NOTE — Progress Notes (Signed)
 CARDIAC REHAB PHASE I    Post OHS education including site care, restrictions, heart healthy  diet, sternal precautions, IS use at home, home needs at discharge, exercise guidelines and CRP2 reviewed. All questions and concerns addressed. Will refer to [facility] for CRP2. Will continue to follow.   1610-9604 Ronny Colas, RN BSN 04/28/2024 12:27 PM

## 2024-04-28 NOTE — TOC Transition Note (Signed)
 Transition of Care [facility]) - Discharge Note Sherin Dingwall RN, BSN Transitions of Care Unit 4E- RN Case Manager See Treatment Team for direct phone #   Patient Details  Name: Gary Castaneda MRN: [id number] Date of Birth: [birth year]  Transition of Care [facility]) CM/SW Contact:  Rox Cope, RN Phone Number: 04/28/2024, 1:32 PM   Clinical Narrative:    Pt stable for transition home today, notified by bedside RN and Cardiac rehab this am that pt will need RW for home. Order has been placed and DME arranged by in house provider [employer]. (Pt with no preference on DME provider).   DME -RW to be delivered to pt prior to discharge.  No HH needs noted.   Wife to transport home.    Final next level of care: Home/Self Care Barriers to Discharge: Barriers Resolved   Patient Goals and CMS Choice Patient states their goals for this hospitalization and ongoing recovery are:: wants to recover   Choice offered to / list presented to : Patient      Discharge Placement               Home        Discharge Plan and Services Additional resources added to the After Visit Summary for     Discharge Planning Services: CM Consult Post Acute Care Choice: Durable Medical Equipment          DME Arranged: Otho Blitz rolling DME Agency: [employer] Date DME Agency Contacted: 04/28/24 Time DME Agency Contacted: 1130 Representative spoke with at DME Agency: zach HH Arranged: NA HH Agency: NA        Social Drivers of Health (SDOH) Interventions SDOH Screenings   Food Insecurity: No Food Insecurity (04/26/2024)  Housing: Unknown (04/26/2024)  Transportation Needs: No Transportation Needs (04/26/2024)  Utilities: Not At Risk (04/26/2024)  Financial Resource Strain: Low Risk  (10/17/2023)   Received from [facility]  Social Connections: Unknown (04/01/2022)   Received from [facility]  Tobacco Use: Low Risk  (04/24/2024)     Readmission Risk Interventions    04/28/2024    1:32 PM   Readmission Risk Prevention Plan  Post Dischage Appt Complete  Medication Screening Complete  Transportation Screening Complete

## 2024-04-28 NOTE — Anesthesia Preprocedure Evaluation (Signed)
 Anesthesia Evaluation  Patient identified by MRN, date of birth, ID band Patient awake    Reviewed: Allergy & Precautions, NPO status , Patient's Chart, lab work & pertinent test results  History of Anesthesia Complications Negative for: history of anesthetic complications  Airway Mallampati: III  TM Distance: >3 FB Neck ROM: Full    Dental  (+) Dental Advisory Given   Pulmonary neg pulmonary ROS   breath sounds clear to auscultation       Cardiovascular hypertension, Pt. on medications (-) angina + CAD   Rhythm:Regular  1. Left ventricular ejection fraction, by estimation, is 60 to 65%. Left  ventricular ejection fraction by 3D volume is 65 %. The left ventricle has  normal function. The left ventricle has no regional wall motion  abnormalities. Left ventricular diastolic   parameters were normal.   2. Right ventricular systolic function is normal. The right ventricular  size is normal.   3. The mitral valve is normal in structure. Trivial mitral valve  regurgitation. No evidence of mitral stenosis.   4. The aortic valve is tricuspid. Aortic valve regurgitation is not  visualized. No aortic stenosis is present.   5. The inferior vena cava is normal in size with greater than 50%  respiratory variability, suggesting right atrial pressure of 3 mmHg.   Conclusion(s)/Recommendation(s): Normal biventricular function without  evidence of hemodynamically significant valvular heart disease.      Prox RCA lesion is 40% stenosed. Mid RCA lesion is 70% stenosed. Dist RCA-2 lesion is 70% stenosed. Dist RCA-1 lesion is 70% stenosed.   Ost LAD to Prox LAD lesion is 70% stenosed. Prox LAD to Mid LAD lesion is 10% stenosed.  After the segmental 10% stenosis, the RFR was highly positive at 0.84.   Ramus lesion is 90% stenosed.   Prox Cx to Mid Cx lesion is 85% stenosed.   --------------------------------------------   The left  ventricular systolic function is normal.  The left ventricular ejection fraction is 55-65% by visual estimate.  LV end diastolic pressure is normal.   There is no aortic valve stenosis.   Diagnostic: Dominance: Right                                 Neuro/Psych negative neurological ROS  negative psych ROS   GI/Hepatic negative GI ROS, Neg liver ROS,,,  Endo/Other  negative endocrine ROS    Renal/GU negative Renal ROS     Musculoskeletal negative musculoskeletal ROS (+)    Abdominal   Peds  Hematology negative hematology ROS (+)   Anesthesia Other Findings   Reproductive/Obstetrics                              Anesthesia Physical Anesthesia Plan  ASA: 4  Anesthesia Plan: General   Post-op Pain Management:    Induction: Intravenous  PONV Risk Score and Plan: 3 and Ondansetron   Airway Management Planned: Oral ETT  Additional Equipment: Arterial line, TEE, CVP and Ultrasound Guidance Line Placement  Intra-op Plan: Utilization Of Total Body Hypothermia per surgeon request  Post-operative Plan: Post-operative intubation/ventilation  Informed Consent: I have reviewed the patients History and Physical, chart, labs and discussed the procedure including the risks, benefits and alternatives for the proposed anesthesia with the patient or authorized representative who has indicated his/her understanding and acceptance.     Dental advisory given  Plan Discussed with: CRNA  Anesthesia Plan Comments:          Anesthesia Quick Evaluation

## 2024-04-28 NOTE — Progress Notes (Signed)
 Gary Castaneda to be D/C'd Home per MD order.  Discussed with the patient and all questions fully answered.  VSS, Skin clean, dry and intact without evidence of skin break down, no evidence of skin tears noted. IV catheter discontinued intact. Site without signs and symptoms of complications. Dressing and pressure applied.  An After Visit Summary was printed and given to the patient. Patient received prescription.  D/c education completed with patient/family including follow up instructions, medication list, d/c activities limitations if indicated, with other d/c instructions as indicated by MD - patient able to verbalize understanding, all questions fully answered.   Patient instructed to return to ED, call 911, or call MD for any changes in condition.   Patient escorted via WC, and D/C home via private auto.  [name] 04/28/2024 1:43 PM

## 2024-05-01 ENCOUNTER — Telehealth (HOSPITAL_COMMUNITY): Payer: Self-pay

## 2024-05-01 MED FILL — Electrolyte-R (PH 7.4) Solution: INTRAVENOUS | Qty: 4000 | Status: AC

## 2024-05-01 MED FILL — Mannitol IV Soln 20%: INTRAVENOUS | Qty: 500 | Status: AC

## 2024-05-01 MED FILL — Calcium Chloride Inj 10%: INTRAVENOUS | Qty: 10 | Status: AC

## 2024-05-01 MED FILL — Heparin Sodium (Porcine) Inj 1000 Unit/ML: INTRAMUSCULAR | Qty: 10 | Status: AC

## 2024-05-01 NOTE — Telephone Encounter (Signed)
 Attempted to call patient in regards to Cardiac Rehab - LM on VM

## 2024-05-02 ENCOUNTER — Ambulatory Visit
Payer: Self-pay | Attending: Thoracic Surgery (Cardiothoracic Vascular Surgery) | Admitting: Thoracic Surgery (Cardiothoracic Vascular Surgery)

## 2024-05-02 DIAGNOSIS — Z951 Presence of aortocoronary bypass graft: Secondary | ICD-10-CM

## 2024-05-02 NOTE — Progress Notes (Signed)
     [address]       [place] [address]             [phone]       Patient: Home Provider: Office Consent for Telemedicine visit obtained.  Today's visit was completed via a real-time telehealth (see specific modality noted below). The patient/authorized person provided oral consent at the time of the visit to engage in a telemedicine encounter with the present provider at [facility]. The patient/authorized person was informed of the potential benefits, limitations, and risks of telemedicine. The patient/authorized person expressed understanding that the laws that protect confidentiality also apply to telemedicine. The patient/authorized person acknowledged understanding that telemedicine does not provide emergency services and that he or she would need to call 911 or proceed to the nearest hospital for help if such a need arose.   Total time spent in the clinical discussion 10 minutes.  Telehealth Modality: Phone visit (audio only)  I had a telephone visit with  Gary Castaneda who is s/p CABG.  Overall doing well.  Pain is minimal.  Ambulating well. Vitals have been stable.  [name] will see us  back in 1 month with a chest x-ray for cardiac rehab clearance.  [name]

## 2024-05-14 NOTE — Progress Notes (Unsigned)
 Cardiology Office Note    Patient Name: Gary Castaneda Date of Encounter: 05/15/2024  Primary Care Provider:  Jearlean Mince, PA-C Primary Cardiologist:  Dorothye Gathers, MD Primary Electrophysiologist: None   Past Medical History    Past Medical History:  Diagnosis Date   Coronary artery disease    HTN (hypertension)    Hyperlipidemia LDL goal < 100     History of Present Illness  Gary Castaneda is a 53 y.o. male with a PMH of CAD s/p CABG x 4 (LIMA LAD, RSVG PDA OM, L Radial RI) , HTN, vasovagal syncope, HLD, family history of CAD who presents today for post bypass follow-up.  Gary Castaneda was last seen on 04/03/2024 for evaluation prior to left heart cath procedure due to elevated calcium  score.  He underwent left heart cath on 04/08/2024 that revealed three-vessel CAD with multivessel lesions in the RCA, 70% ostial LAD, 90% RI and 90% proximal RCA.  He was consulted by Dr. Deloise Ferries on 04/18/2024 with recommendation to undergo bypass which was completed on 04/24/2024.  He had CABG x 4 utilizing LIMA LAD, RSVG PDA OM, L Radial RI.  He did well postprocedure and maintaining sinus rhythm with postop 2D echo showing EF of 60 to 65%.  He was discharged on 04/28/2024 and seen by Dr. Deloise Ferries via televisit on 05/02/2024 and reported doing well.  Gary Castaneda presents today with his wife for post hospital follow-up.  He was discharged home on April 28, 2024, and had a televisit for follow-up. He is awaiting clearance for cardiac rehabilitation, expected to start after his appointment on June 03, 2024. He is currently not cleared to drive and is walking laps around his house for exercise. His current medications include Repatha , Zetia , rosuvastatin  (Crestor ), and aspirin , with no reported issues. His blood pressure, previously elevated, is now well-controlled. No dizziness or palpitations are noted. He reports some weakness in his left arm, attributed to muscle recovery post-surgery. No fine motor  issues are present, but the left arm gets cold. No shortness of breath, chest pain, or palpitations are reported. He has a wet cough in the mornings, resolving after getting out of bed, and uses a spirometer regularly to aid in lung recovery. His family history includes a father who experienced depression post-bypass surgery. He has three children, and there is a discussion about the importance of early screening for coronary artery disease due to genetic predisposition. Patient denies chest pain, palpitations, dyspnea, PND, orthopnea, nausea, vomiting, dizziness, syncope, edema, weight gain, or early satiety.  Discussed the use of AI scribe software for clinical note transcription with the patient, who gave verbal consent to proceed.  History of Present Illness   Review of Systems  Please see the history of present illness.    All other systems reviewed and are otherwise negative except as noted above.  Physical Exam    Wt Readings from Last 3 Encounters:  05/15/24 145 lb 9.6 oz (66 kg)  04/28/24 150 lb 2.1 oz (68.1 kg)  04/22/24 153 lb (69.4 kg)   VS: Vitals:   05/15/24 1100  BP: 102/70  Pulse: 83  SpO2: 99%  ,Body mass index is 23.5 kg/m. GEN: Well nourished, well developed in no acute distress Neck: No JVD; No carotid bruits Pulmonary: Clear to auscultation without rales, wheezing or rhonchi  Cardiovascular: Normal rate. Regular rhythm. Normal S1. Normal S2.  Sternotomy incision clean dry and intact with slight numbness to the touch with decreased grip strength to the  left hand following vein graft and no swelling to right lower extremity after saphenous vein graft Murmurs: There is no murmur.  ABDOMEN: Soft, non-tender, non-distended EXTREMITIES:  No edema; No deformity   EKG/LABS/ Recent Cardiac Studies   ECG personally reviewed by me today -none completed today  Risk Assessment/Calculations:          Lab Results  Component Value Date   WBC 6.0 04/28/2024   HGB 9.1  (L) 04/28/2024   HCT 27.2 (L) 04/28/2024   MCV 98.9 04/28/2024   PLT 220 04/28/2024   Lab Results  Component Value Date   CREATININE 0.87 04/28/2024   BUN 16 04/28/2024   NA 136 04/28/2024   K 3.4 (L) 04/28/2024   CL 102 04/28/2024   CO2 22 04/28/2024   No results found for: CHOL, HDL, LDLCALC, LDLDIRECT, TRIG, CHOLHDL  Lab Results  Component Value Date   HGBA1C 4.9 04/22/2024   Assessment & Plan   Assessment & Plan   1.  History of CAD: -s/p LHC with multivessel CAD and CABG x 4 LIMA LAD, RSVG PDA OM, L Radial RI  -Patient reports no complaints of chest pain or angina following surgery. - Left arm weakness and coldness likely due to nerve and muscle recovery. Encouraged cardiac rehab for habit building and cardiovascular health improvement. - Order chest x-ray to evaluate for pneumonia due to decreased breath sounds on the left side. - Check blood work to rule out anemia and assess kidney function. - Encourage use of spirometer, coughing, and deep breathing exercises. - Release to cardiac rehab pending clearance from Dr. Deloise Ferries. - Encourage walking and gradual increase in physical activity. - Continue metoprolol  12.5 mg twice daily, ezetimibe  10 mg daily, Crestor  20 mg, Repatha  420 mg every 30 days and ASA 81 mg  2.  Essential hypertension: - Patient's blood pressure today was well-controlled at 102/70 - Continue metoprolol  12.5 mg twice daily  3.  Hyperlipidemia -Managed with rosuvastatin , Repatha , and Zetia . Lifelong medication necessary due to genetic predisposition to high cholesterol. - Continue rosuvastatin , Repatha , and Zetia  indefinitely. - Monitor lipid levels regularly.  4. Risk of pneumonia post-surgery: Decreased breath sounds on the left side and occasional wet cough. No fever or chills. Using spirometer and deep breathing exercises to mitigate risk. - Order chest x-ray to evaluate for pneumonia. - Continue use of spirometer and encourage  deep breathing exercises.    Cardiac Rehabilitation Eligibility Assessment  The patient is ready to start cardiac rehabilitation pending clearance from the cardiac surgeon.    Disposition: Follow-up with Dorothye Gathers, MD or APP in 3 months   Signed, Francene Ing, Retha Cast, NP 05/15/2024, 12:20 PM [facility]

## 2024-05-15 ENCOUNTER — Encounter: Payer: Self-pay | Admitting: Nurse Practitioner

## 2024-05-15 ENCOUNTER — Ambulatory Visit: Attending: Nurse Practitioner | Admitting: Nurse Practitioner

## 2024-05-15 VITALS — BP 102/70 | HR 83 | Ht 66.0 in | Wt 145.6 lb

## 2024-05-15 DIAGNOSIS — E782 Mixed hyperlipidemia: Secondary | ICD-10-CM

## 2024-05-15 DIAGNOSIS — I2510 Atherosclerotic heart disease of native coronary artery without angina pectoris: Secondary | ICD-10-CM | POA: Diagnosis not present

## 2024-05-15 DIAGNOSIS — Z9189 Other specified personal risk factors, not elsewhere classified: Secondary | ICD-10-CM

## 2024-05-15 DIAGNOSIS — I10 Essential (primary) hypertension: Secondary | ICD-10-CM

## 2024-05-15 DIAGNOSIS — Z951 Presence of aortocoronary bypass graft: Secondary | ICD-10-CM | POA: Diagnosis not present

## 2024-05-15 DIAGNOSIS — I2584 Coronary atherosclerosis due to calcified coronary lesion: Secondary | ICD-10-CM | POA: Diagnosis not present

## 2024-05-15 MED ORDER — REPATHA SURECLICK 140 MG/ML ~~LOC~~ SOAJ: 1.0000 mL | SUBCUTANEOUS | 0 refills | Status: DC

## 2024-05-15 NOTE — Patient Instructions (Signed)
 Medication Instructions:  Your physician recommends that you continue on your current medications as directed. Please refer to the Current Medication list given to you today. *If you need a refill on your cardiac medications before your next appointment, please call your pharmacy*  Lab Work: TODAY-BMET & CBC If you have labs (blood work) drawn today and your tests are completely normal, you will receive your results only by: MyChart Message (if you have MyChart) OR A paper copy in the mail If you have any lab test that is abnormal or we need to change your treatment, we will call you to review the results.  Testing/Procedures: A chest x-ray takes a picture of the organs and structures inside the chest, including the heart, lungs, and blood vessels. This test can show several things, including, whether the heart is enlarges; whether fluid is building up in the lungs; and whether pacemaker / defibrillator leads are still in place.   Follow-Up: At [facility], you and your health needs are our priority.  As part of our continuing mission to provide you with exceptional heart care, our providers are all part of one team.  This team includes your primary Cardiologist (physician) and Advanced Practice Providers or APPs (Physician Assistants and Nurse Practitioners) who all work together to provide you with the care you need, when you need it.  Your next appointment:   3 month(s)  Provider:   Dorothye Gathers, MD    We recommend signing up for the patient portal called MyChart.  Sign up information is provided on this After Visit Summary.  MyChart is used to connect with patients for Virtual Visits (Telemedicine).  Patients are able to view lab/test results, encounter notes, upcoming appointments, etc.  Non-urgent messages can be sent to your provider as well.   To learn more about what you can do with MyChart, go to [email or web address].   Other Instructions

## 2024-05-16 ENCOUNTER — Ambulatory Visit (HOSPITAL_COMMUNITY)
Admission: RE | Admit: 2024-05-16 | Discharge: 2024-05-16 | Disposition: A | Discharge status: Home / Self Care | Source: Ambulatory Visit | Attending: Cardiology | Admitting: Cardiology

## 2024-05-16 ENCOUNTER — Ambulatory Visit: Payer: Self-pay | Admitting: Nurse Practitioner

## 2024-05-16 DIAGNOSIS — I2510 Atherosclerotic heart disease of native coronary artery without angina pectoris: Secondary | ICD-10-CM | POA: Insufficient documentation

## 2024-05-16 DIAGNOSIS — Z951 Presence of aortocoronary bypass graft: Secondary | ICD-10-CM | POA: Insufficient documentation

## 2024-05-16 DIAGNOSIS — I10 Essential (primary) hypertension: Secondary | ICD-10-CM | POA: Diagnosis not present

## 2024-05-16 DIAGNOSIS — I2584 Coronary atherosclerosis due to calcified coronary lesion: Secondary | ICD-10-CM | POA: Diagnosis not present

## 2024-05-16 DIAGNOSIS — E782 Mixed hyperlipidemia: Secondary | ICD-10-CM | POA: Insufficient documentation

## 2024-05-16 DIAGNOSIS — R918 Other nonspecific abnormal finding of lung field: Secondary | ICD-10-CM | POA: Diagnosis not present

## 2024-05-16 DIAGNOSIS — J90 Pleural effusion, not elsewhere classified: Secondary | ICD-10-CM | POA: Diagnosis not present

## 2024-05-16 DIAGNOSIS — R059 Cough, unspecified: Secondary | ICD-10-CM | POA: Diagnosis not present

## 2024-05-16 LAB — BASIC METABOLIC PANEL WITH GFR
BUN/Creatinine Ratio: 15 (ref 9–20)
BUN: 15 mg/dL (ref 6–24)
CO2: 24 mmol/L (ref 20–29)
Calcium: 9.9 mg/dL (ref 8.7–10.2)
Chloride: 101 mmol/L (ref 96–106)
Creatinine, Ser: 0.99 mg/dL (ref 0.76–1.27)
Glucose: 94 mg/dL (ref 70–99)
Potassium: 5 mmol/L (ref 3.5–5.2)
Sodium: 138 mmol/L (ref 134–144)
eGFR: 93 mL/min/{1.73_m2} (ref >59)

## 2024-05-16 LAB — CBC
Hematocrit: 36.3 % — ABNORMAL LOW (ref 37.5–51.0)
Hemoglobin: 12.1 g/dL — ABNORMAL LOW (ref 13.0–17.7)
MCH: 31.9 pg (ref 26.6–33.0)
MCHC: 33.3 g/dL (ref 31.5–35.7)
MCV: 96 fL (ref 79–97)
Platelets: 652 10*3/uL — ABNORMAL HIGH (ref 150–450)
RBC: 3.79 x10E6/uL — ABNORMAL LOW (ref 4.14–5.80)
RDW: 11.9 % (ref 11.6–15.4)
WBC: 6.1 10*3/uL (ref 3.4–10.8)

## 2024-05-23 ENCOUNTER — Other Ambulatory Visit: Payer: Self-pay | Admitting: Thoracic Surgery (Cardiothoracic Vascular Surgery)

## 2024-05-23 DIAGNOSIS — Z951 Presence of aortocoronary bypass graft: Secondary | ICD-10-CM

## 2024-05-28 ENCOUNTER — Other Ambulatory Visit (HOSPITAL_COMMUNITY): Payer: Self-pay

## 2024-06-03 ENCOUNTER — Ambulatory Visit: Payer: Self-pay | Attending: Thoracic Surgery (Cardiothoracic Vascular Surgery) | Admitting: Surgical

## 2024-06-03 ENCOUNTER — Ambulatory Visit (HOSPITAL_COMMUNITY)
Admission: RE | Admit: 2024-06-03 | Discharge: 2024-06-03 | Disposition: A | Discharge status: Home / Self Care | Source: Ambulatory Visit | Attending: Cardiology | Admitting: Cardiology

## 2024-06-03 VITALS — BP 123/82 | HR 67 | Resp 20 | Ht 66.0 in | Wt 146.9 lb

## 2024-06-03 DIAGNOSIS — Z48812 Encounter for surgical aftercare following surgery on the circulatory system: Secondary | ICD-10-CM | POA: Diagnosis not present

## 2024-06-03 DIAGNOSIS — Z951 Presence of aortocoronary bypass graft: Secondary | ICD-10-CM | POA: Diagnosis not present

## 2024-06-03 DIAGNOSIS — J9811 Atelectasis: Secondary | ICD-10-CM | POA: Diagnosis not present

## 2024-06-03 NOTE — Progress Notes (Signed)
 [address]       [place] [address]             [phone]      JOSAIAH MUHAMMED [facility] Medical Record #[id number] Date of Birth: 04/04/1971  Referring: Anner Alm ORN, MD Primary Care: Jacques Camie Pepper, [place] Primary Cardiologist: [name], MD   Chief Complaint:   POST OP FOLLOW UP    04/24/2024 Patient:  Gary Castaneda Ling Pre-Op Dx: 3V CAD HTN HLP   Post-op Dx:  same Procedure: CABG X 4.  LIMA LAD, RSVG PDA OM, L Radial RI (T graft off OM vein graft) Endoscopic greater saphenous vein harvest on the right Open left radial artery harvest     Surgeon and Role:      * Lightfoot, Linnie KIDD, MD - Primary    * EMERSON Becket , PA-C - assisting   History of Present Illness:    The patient is a 53 year old male status post the above procedure and seen in the office on today's date and routine postsurgical follow-up.  Overall, he reports that he is making good progress.  He denies fevers, chills or other significant constitutional symptoms.  He is not having significant pain and is only requiring Tylenol  at night as needed.  He has not had any difficulty with his incisions healing.  He denies shortness of breath, chest pain or palpitations.  He does have some left lower extremity edema which is chronic.  Overall, he is pleased with his general progress.      Past Medical History:  Diagnosis Date   Coronary artery disease    HTN (hypertension)    Hyperlipidemia LDL goal < 100      Social History   Tobacco Use  Smoking Status Never  Smokeless Tobacco Never    Social History   Substance and Sexual Activity  Alcohol Use No   Comment: occasional     No Known Allergies  Current Outpatient Medications  Medication Sig Dispense Refill   amLODipine  (NORVASC ) 5 MG tablet Take 1 tablet (5 mg total) by mouth daily. 30 tablet 1   Ascorbic Acid (VITAMIN C PO) Take 1 tablet by mouth daily.     aspirin  EC 325 MG tablet Take 1 tablet (325  mg total) by mouth daily. 30 tablet 1   Cholecalciferol (D3 PO) Take 1 tablet by mouth daily.     Evolocumab  with Infusor (REPATHA  PUSHTRONEX SYSTEM) 420 MG/3.5ML SOCT Inject 420 mg into the skin every 30 (thirty) days. 3.5 mL 3   ezetimibe  (ZETIA ) 10 MG tablet Take 10 mg by mouth daily.     Ferrous Sulfate (IRON PO) Take 1 tablet by mouth daily.     Menaquinone-7 (K2 PO) Take 1 tablet by mouth daily.     metoprolol  tartrate (LOPRESSOR ) 25 MG tablet Take 0.5 tablets (12.5 mg total) by mouth 2 (two) times daily. 60 tablet 1   potassium chloride  SA (KLOR-CON  M) 20 MEQ tablet Take 1 tablet (20 mEq total) by mouth 2 (two) times daily for 1 day. 2 tablet 0   REPATHA  SURECLICK 140 MG/ML SOAJ Inject 140 mg into the skin every 14 (fourteen) days. 2 mL 0   rosuvastatin  (CRESTOR ) 20 MG tablet TAKE (1) TABLET DAILY AT BEDTIME. 90 tablet 0   No current facility-administered medications for this visit.       Physical Exam: BP 123/82 (BP Location: Right Arm, Patient Position: Sitting, Cuff Size: Normal)  Pulse 67   Resp 20   Ht 5' 6 (1.676 m)   Wt 146 lb 14.4 oz (66.6 kg)   SpO2 98% Comment: RA  BMI 23.71 kg/m   General appearance: alert, cooperative, and no distress Heart: regular rate and rhythm Lungs: clear to auscultation bilaterally Abdomen: Benign Extremities: Left lower extremity edema Wound: Incisions healing well without evidence of infection.  Left upper arm radial harvest site healing well and sensory intact.   Diagnostic Studies & Laboratory data:     Recent Radiology Findings:   DG Chest 2 View Result Date: 06/03/2024 CLINICAL DATA:  Status post coronary artery bypass graft. EXAM: CHEST - 2 VIEW COMPARISON:  May 16, 2024. FINDINGS: The heart size and mediastinal contours are within normal limits. Sternotomy wires are noted. Right lung is clear. Significantly decreased left basilar opacity is noted. The visualized skeletal structures are unremarkable. IMPRESSION: Significantly  decreased left posterior basilar opacity is noted suggesting improving atelectasis and possible associated effusion. Electronically Signed   By: Lynwood Landy Raddle M.D.   On: 06/03/2024 13:56       Recent Lab Findings: Lab Results  Component Value Date   WBC 6.1 05/15/2024   HGB 12.1 (L) 05/15/2024   HCT 36.3 (L) 05/15/2024   PLT 652 (H) 05/15/2024   GLUCOSE 94 05/15/2024   ALT 44 04/22/2024   AST 48 (H) 04/22/2024   NA 138 05/15/2024   K 5.0 05/15/2024   CL 101 05/15/2024   CREATININE 0.99 05/15/2024   BUN 15 05/15/2024   CO2 24 05/15/2024   INR 1.4 (H) 04/24/2024   HGBA1C 4.9 04/22/2024      Assessment / Plan: Doing well status post CABG.  I reviewed his chest x-ray and there are no concerning findings of effusions or infiltrates.  The wires are in place.  I did not make any changes to his current medication regimen.  We will see the patient again on a as needed basis for any surgically related needs or at request.    May begin cardiac rehab phase 2  Medication Changes: No orders of the defined types were placed in this encounter.     Antha Niday E Angelena Sand PA-C  06/03/2024 2:07 PM

## 2024-06-03 NOTE — Patient Instructions (Signed)
 Routine activity progression as discussed including driving and lifting restrictions.  May shower with restrictions as discussed.  Okay to begin cardiac rehab phase 2.

## 2024-06-11 ENCOUNTER — Encounter (HOSPITAL_COMMUNITY): Payer: Self-pay

## 2024-06-11 DIAGNOSIS — E785 Hyperlipidemia, unspecified: Secondary | ICD-10-CM | POA: Diagnosis not present

## 2024-06-11 DIAGNOSIS — I10 Essential (primary) hypertension: Secondary | ICD-10-CM | POA: Diagnosis not present

## 2024-06-11 DIAGNOSIS — I25810 Atherosclerosis of coronary artery bypass graft(s) without angina pectoris: Secondary | ICD-10-CM | POA: Diagnosis not present

## 2024-06-18 ENCOUNTER — Encounter (HOSPITAL_COMMUNITY)
Admission: RE | Admit: 2024-06-18 | Discharge: 2024-06-18 | Disposition: A | Discharge status: Home / Self Care | Source: Ambulatory Visit | Attending: Cardiology | Admitting: Cardiology

## 2024-06-18 VITALS — BP 104/68 | HR 68 | Ht 66.0 in | Wt 145.3 lb

## 2024-06-18 DIAGNOSIS — Z951 Presence of aortocoronary bypass graft: Secondary | ICD-10-CM | POA: Insufficient documentation

## 2024-06-18 NOTE — Progress Notes (Signed)
 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  Patient's Home Medications on Admission:  Current Outpatient Medications:    amLODipine  (NORVASC ) 5 MG tablet, Take 1 tablet (5 mg total) by mouth daily., Disp: 30 tablet, Rfl: 1   Ascorbic Acid (VITAMIN C PO), Take 1 tablet by mouth daily., Disp: , Rfl:    aspirin  EC 325 MG tablet, Take 1 tablet (325 mg total) by mouth daily., Disp: 30 tablet, Rfl: 1   Cholecalciferol (D3 PO), Take 1 tablet by mouth daily., Disp: , Rfl:    Evolocumab  with Infusor (REPATHA  PUSHTRONEX SYSTEM) 420 MG/3.5ML SOCT, Inject 420 mg into the skin every 30 (thirty) days., Disp: 3.5 mL, Rfl: 3   ezetimibe  (ZETIA ) 10 MG tablet, Take 10 mg by mouth daily., Disp: , Rfl:    Ferrous Sulfate (IRON PO), Take 1 tablet by mouth daily., Disp: , Rfl:    Menaquinone-7 (K2 PO), Take 1 tablet by mouth daily., Disp: , Rfl:    metoprolol  tartrate (LOPRESSOR ) 25 MG tablet, Take 0.5 tablets (12.5 mg total) by mouth 2 (two) times daily., Disp: 60 tablet, Rfl: 1   REPATHA  SURECLICK 140 MG/ML SOAJ, Inject 140 mg into the skin every 14 (fourteen) days., Disp: 2 mL, Rfl: 0   rosuvastatin  (CRESTOR ) 20 MG tablet, TAKE (1) TABLET DAILY AT BEDTIME., Disp: 90 tablet, Rfl: 0   potassium chloride  SA (KLOR-CON  M) 20 MEQ tablet, Take 1 tablet (20 mEq total) by mouth 2 (two) times daily for 1 day. (Patient not taking: Reported on 06/18/2024), Disp: 2 tablet, Rfl: 0  Past Medical History: Past Medical History:  Diagnosis Date    Coronary artery disease    HTN (hypertension)    Hyperlipidemia LDL goal < 100     Tobacco Use: Social History   Tobacco Use  Smoking Status Never  Smokeless Tobacco Never    Labs: Review Flowsheet       Latest Ref Rng & Units 04/22/2024 04/24/2024  Labs for ITP Cardiac and Pulmonary Rehab  Hemoglobin A1c 4.8 - 5.6 % 4.9  -  PH, Arterial 7.35 - 7.45 - 7.327  7.408  7.462  7.453  7.440   PCO2 arterial 32 - 48 mmHg - 46.1  28.3  33.9  32.9  36.7   Bicarbonate 20.0 - 28.0 mmol/L - 24.5  18.0  24.2  25.3  23.0  24.9   TCO2 22 - 32 mmol/L - 26  19  25  26  27  26  24  28  25  26    Acid-base deficit 0.0 - 2.0 mmol/L - 2.0  6.0  1.0   O2 Saturation % - 98  99  100  89  100  100     Details       Multiple values from one day are sorted in reverse-chronological order         Capillary Blood Glucose: Lab Results  Component Value Date   GLUCAP 118 (H) 04/26/2024   GLUCAP 120 (H) 04/26/2024   GLUCAP 111 (H)  04/26/2024   GLUCAP 136 (H) 04/26/2024   GLUCAP 111 (H) 04/26/2024     Exercise Target Goals: Exercise Program Goal: Individual exercise prescription set using results from initial 6 min walk test and THRR while considering  patient's activity barriers and safety.   Exercise Prescription Goal: Initial exercise prescription builds to 30-45 minutes a day of aerobic activity, 2-3 days per week.  Home exercise guidelines will be given to patient during program as part of exercise prescription that the participant will acknowledge.  Activity Barriers & Risk Stratification:  Activity Barriers & Cardiac Risk Stratification - 06/18/24 1517       Activity Barriers & Cardiac Risk Stratification   Activity Barriers Incisional Pain;Other (comment)    Comments sternal precautions    Cardiac Risk Stratification High   <5 METs on         6 Minute Walk:  6 Minute Walk     Row Name 06/18/24 1517         6 Minute Walk   Phase Initial     Distance 1340 feet      Walk Time 6 minutes     # of Rest Breaks 0     MPH 2.54     METS 1.06     RPE 9.5     Perceived Dyspnea  0     VO2 Peak 14.21     Symptoms No     Resting HR 68 bpm     Resting BP 104/68     Resting Oxygen Saturation  98 %     Exercise Oxygen Saturation  during 6 min walk 98 %     Max Ex. HR 79 bpm     Max Ex. BP 130/62     2 Minute Post BP 100/72        Oxygen Initial Assessment:   Oxygen Re-Evaluation:   Oxygen Discharge (Final Oxygen Re-Evaluation):   Initial Exercise Prescription:  Initial Exercise Prescription - 06/18/24 1500       Date of Initial Exercise RX and Referring Provider   Date 06/18/24    Referring Provider [name], MD    Expected Discharge Date 09/10/24      Treadmill   MPH 3    Grade 0    Minutes 15    METs 2.5      Recumbant Elliptical   Level 1    RPM 60    Watts 70    Minutes 15    METs 2.5      Prescription Details   Frequency (times per week) 3    Duration Progress to 30 minutes of continuous aerobic without signs/symptoms of physical distress      Intensity   THRR 40-80% of Max Heartrate 68-136    Ratings of Perceived Exertion 11-13    Perceived Dyspnea 0-4      Progression   Progression Continue progressive overload as per policy without signs/symptoms or physical distress.      Resistance Training   Training Prescription Yes    Weight 3    Reps 10-15          Perform Capillary Blood Glucose checks as needed.  Exercise Prescription Changes:   Exercise Comments:   Exercise Goals and Review:   Exercise Goals     Row Name 06/18/24 1519             Exercise Goals   Increase Physical Activity Yes       Intervention Provide  advice, education, support and counseling about physical activity/exercise needs.;Develop an individualized exercise prescription for aerobic and resistive training based on initial evaluation findings, risk stratification, comorbidities and participant's personal goals.        Expected Outcomes Long Term: Add in home exercise to make exercise part of routine and to increase amount of physical activity.;Long Term: Exercising regularly at least 3-5 days a week.;Short Term: Attend rehab on a regular basis to increase amount of physical activity.       Increase Strength and Stamina Yes       Intervention Develop an individualized exercise prescription for aerobic and resistive training based on initial evaluation findings, risk stratification, comorbidities and participant's personal goals.;Provide advice, education, support and counseling about physical activity/exercise needs.       Expected Outcomes Short Term: Increase workloads from initial exercise prescription for resistance, speed, and METs.;Long Term: Improve cardiorespiratory fitness, muscular endurance and strength as measured by increased METs and functional capacity ( );Short Term: Perform resistance training exercises routinely during rehab and add in resistance training at home       Able to understand and use rate of perceived exertion (RPE) scale Yes       Intervention Provide education and explanation on how to use RPE scale       Expected Outcomes Short Term: Able to use RPE daily in rehab to express subjective intensity level;Long Term:  Able to use RPE to guide intensity level when exercising independently       Knowledge and understanding of Target Heart Rate Range (THRR) Yes       Intervention Provide education and explanation of THRR including how the numbers were predicted and where they are located for reference       Expected Outcomes Long Term: Able to use THRR to govern intensity when exercising independently;Short Term: Able to state/look up THRR;Short Term: Able to use daily as guideline for intensity in rehab       Understanding of Exercise Prescription Yes       Intervention Provide education, explanation, and written materials on patient's individual exercise prescription       Expected  Outcomes Short Term: Able to explain program exercise prescription;Long Term: Able to explain home exercise prescription to exercise independently          Exercise Goals Re-Evaluation :   Discharge Exercise Prescription (Final Exercise Prescription Changes):   Nutrition:  Target Goals: Understanding of nutrition guidelines, daily intake of sodium 1500mg , cholesterol 200mg , calories 30% from fat and 7% or less from saturated fats, daily to have 5 or more servings of fruits and vegetables.  Biometrics:  Pre Biometrics - 06/18/24 1508       Pre Biometrics   Waist Circumference 35 inches    Hip Circumference 36 inches    Waist to Hip Ratio 0.97 %    Triceps Skinfold 12 mm    % Body Fat 22.3 %    Grip Strength 24 kg    Flexibility 12.25 in    Single Leg Stand 9.52 seconds           Nutrition Therapy Plan and Nutrition Goals:   Nutrition Assessments:  MEDIFICTS Score Key: >=70 Need to make dietary changes  40-70 Heart Healthy Diet <= 40 Therapeutic Level Cholesterol Diet    Picture Your Plate Scores: <59 Unhealthy dietary pattern with much room for improvement. 41-50 Dietary pattern unlikely to meet recommendations for good health and room for improvement. 51-60 More healthful dietary pattern, with some  room for improvement.  >60 Healthy dietary pattern, although there may be some specific behaviors that could be improved.    Nutrition Goals Re-Evaluation:   Nutrition Goals Re-Evaluation:   Nutrition Goals Discharge (Final Nutrition Goals Re-Evaluation):   Psychosocial: Target Goals: Acknowledge presence or absence of significant depression and/or stress, maximize coping skills, provide positive support system. Participant is able to verbalize types and ability to use techniques and skills needed for reducing stress and depression.  Initial Review & Psychosocial Screening:  Initial Psych Review & Screening - 06/18/24 1520       Initial Review   Current  issues with None Identified      Family Dynamics   Good Support System? Yes   wife     Barriers   Psychosocial barriers to participate in program There are no identifiable barriers or psychosocial needs.      Screening Interventions   Interventions Provide feedback about the scores to participant;Encouraged to exercise    Expected Outcomes Long Term goal: The participant improves quality of Life and PHQ9 Scores as seen by post scores and/or verbalization of changes;Short Term goal: Identification and review with participant of any Quality of Life or Depression concerns found by scoring the questionnaire.          Quality of Life Scores:  Quality of Life - 06/18/24 1520       Quality of Life   Select Quality of Life      Quality of Life Scores   Health/Function Pre 19.8 %    Socioeconomic Pre 25.36 %    Psych/Spiritual Pre 21.93 %    Family Pre 25.2 %    GLOBAL Pre 22.18 %         Scores of 19 and below usually indicate a poorer quality of life in these areas.  A difference of  2-3 points is a clinically meaningful difference.  A difference of 2-3 points in the total score of the Quality of Life Index has been associated with significant improvement in overall quality of life, self-image, physical symptoms, and general health in studies assessing change in quality of life.  PHQ-9: Review Flowsheet       06/18/2024  Depression screen PHQ 2/9  Decreased Interest 0  Down, Depressed, Hopeless 0  PHQ - 2 Score 0  Altered sleeping 0  Tired, decreased energy 0  Change in appetite 0  Feeling bad or failure about yourself  0  Trouble concentrating 0  Moving slowly or fidgety/restless 0  Suicidal thoughts 0  PHQ-9 Score 0   Interpretation of Total Score  Total Score Depression Severity:  1-4 = Minimal depression, 5-9 = Mild depression, 10-14 = Moderate depression, 15-19 = Moderately severe depression, 20-27 = Severe depression   Psychosocial Evaluation and  Intervention:   Psychosocial Re-Evaluation:   Psychosocial Discharge (Final Psychosocial Re-Evaluation):   Vocational Rehabilitation: Provide vocational rehab assistance to qualifying candidates.   Vocational Rehab Evaluation & Intervention:  Vocational Rehab - 06/18/24 1521       Initial Vocational Rehab Evaluation & Intervention   Assessment shows need for Vocational Rehabilitation No   working         Education: Education Goals: Education classes will be provided on a weekly basis, covering required topics. Participant will state understanding/return demonstration of topics presented.     Core Videos: Exercise    Move It!  Clinical staff conducted group or individual video education with verbal and written material and guidebook.  Patient learns the  recommended Pritikin exercise program. Exercise with the goal of living a long, healthy life. Some of the health benefits of exercise include controlled diabetes, healthier blood pressure levels, improved cholesterol levels, improved heart and lung capacity, improved sleep, and better body composition. Everyone should speak with their doctor before starting or changing an exercise routine.  Biomechanical Limitations Clinical staff conducted group or individual video education with verbal and written material and guidebook.  Patient learns how biomechanical limitations can impact exercise and how we can mitigate and possibly overcome limitations to have an impactful and balanced exercise routine.  Body Composition Clinical staff conducted group or individual video education with verbal and written material and guidebook.  Patient learns that body composition (ratio of muscle mass to fat mass) is a key component to assessing overall fitness, rather than body weight alone. Increased fat mass, especially visceral belly fat, can put us  at increased risk for metabolic syndrome, type 2 diabetes, heart disease, and even death. It is  recommended to combine diet and exercise (cardiovascular and resistance training) to improve your body composition. Seek guidance from your physician and exercise physiologist before implementing an exercise routine.  Exercise Action Plan Clinical staff conducted group or individual video education with verbal and written material and guidebook.  Patient learns the recommended strategies to achieve and enjoy long-term exercise adherence, including variety, self-motivation, self-efficacy, and positive decision making. Benefits of exercise include fitness, good health, weight management, more energy, better sleep, less stress, and overall well-being.  Medical   Heart Disease Risk Reduction Clinical staff conducted group or individual video education with verbal and written material and guidebook.  Patient learns our heart is our most vital organ as it circulates oxygen, nutrients, white blood cells, and hormones throughout the entire body, and carries waste away. Data supports a plant-based eating plan like the Pritikin Program for its effectiveness in slowing progression of and reversing heart disease. The video provides a number of recommendations to address heart disease.   Metabolic Syndrome and Belly Fat  Clinical staff conducted group or individual video education with verbal and written material and guidebook.  Patient learns what metabolic syndrome is, how it leads to heart disease, and how one can reverse it and keep it from coming back. You have metabolic syndrome if you have 3 of the following 5 criteria: abdominal obesity, high blood pressure, high triglycerides, low HDL cholesterol, and high blood sugar.  Hypertension and Heart Disease Clinical staff conducted group or individual video education with verbal and written material and guidebook.  Patient learns that high blood pressure, or hypertension, is very common in the [place] . Hypertension is largely due to excessive salt  intake, but other important risk factors include being overweight, physical inactivity, drinking too much alcohol, smoking, and not eating enough potassium from fruits and vegetables. High blood pressure is a leading risk factor for heart attack, stroke, congestive heart failure, dementia, kidney failure, and premature death. Long-term effects of excessive salt intake include stiffening of the arteries and thickening of heart muscle and organ damage. Recommendations include ways to reduce hypertension and the risk of heart disease.  Diseases of Our Time - Focusing on Diabetes Clinical staff conducted group or individual video education with verbal and written material and guidebook.  Patient learns why the best way to stop diseases of our time is prevention, through food and other lifestyle changes. Medicine (such as prescription pills and surgeries) is often only a Band-Aid on the problem, not a long-term solution. Most  common diseases of our time include obesity, type 2 diabetes, hypertension, heart disease, and cancer. The Pritikin Program is recommended and has been proven to help reduce, reverse, and/or prevent the damaging effects of metabolic syndrome.  Nutrition   Overview of the Pritikin Eating Plan  Clinical staff conducted group or individual video education with verbal and written material and guidebook.  Patient learns about the Pritikin Eating Plan for disease risk reduction. The Pritikin Eating Plan emphasizes a wide variety of unrefined, minimally-processed carbohydrates, like fruits, vegetables, whole grains, and legumes. Go, Caution, and Stop food choices are explained. Plant-based and lean animal proteins are emphasized. Rationale provided for low sodium intake for blood pressure control, low added sugars for blood sugar stabilization, and low added fats and oils for coronary artery disease risk reduction and weight management.  Calorie Density  Clinical staff conducted group or  individual video education with verbal and written material and guidebook.  Patient learns about calorie density and how it impacts the Pritikin Eating Plan. Knowing the characteristics of the food you choose will help you decide whether those foods will lead to weight gain or weight loss, and whether you want to consume more or less of them. Weight loss is usually a side effect of the Pritikin Eating Plan because of its focus on low calorie-dense foods.  Label Reading  Clinical staff conducted group or individual video education with verbal and written material and guidebook.  Patient learns about the Pritikin recommended label reading guidelines and corresponding recommendations regarding calorie density, added sugars, sodium content, and whole grains.  Dining Out - Part 1  Clinical staff conducted group or individual video education with verbal and written material and guidebook.  Patient learns that restaurant meals can be sabotaging because they can be so high in calories, fat, sodium, and/or sugar. Patient learns recommended strategies on how to positively address this and avoid unhealthy pitfalls.  Facts on Fats  Clinical staff conducted group or individual video education with verbal and written material and guidebook.  Patient learns that lifestyle modifications can be just as effective, if not more so, as many medications for lowering your risk of heart disease. A Pritikin lifestyle can help to reduce your risk of inflammation and atherosclerosis (cholesterol build-up, or plaque, in the artery walls). Lifestyle interventions such as dietary choices and physical activity address the cause of atherosclerosis. A review of the types of fats and their impact on blood cholesterol levels, along with dietary recommendations to reduce fat intake is also included.  Nutrition Action Plan  Clinical staff conducted group or individual video education with verbal and written material and guidebook.   Patient learns how to incorporate Pritikin recommendations into their lifestyle. Recommendations include planning and keeping personal health goals in mind as an important part of their success.  Healthy Mind-Set    Healthy Minds, Bodies, Hearts  Clinical staff conducted group or individual video education with verbal and written material and guidebook.  Patient learns how to identify when they are stressed. Video will discuss the impact of that stress, as well as the many benefits of stress management. Patient will also be introduced to stress management techniques. The way we think, act, and feel has an impact on our hearts.  How Our Thoughts Can Heal Our Hearts  Clinical staff conducted group or individual video education with verbal and written material and guidebook.  Patient learns that negative thoughts can cause depression and anxiety. This can result in negative lifestyle behavior and serious  health problems. Cognitive behavioral therapy is an effective method to help control our thoughts in order to change and improve our emotional outlook.  Additional Videos:  Exercise    Improving Performance  Clinical staff conducted group or individual video education with verbal and written material and guidebook.  Patient learns to use a non-linear approach by alternating intensity levels and lengths of time spent exercising to help burn more calories and lose more body fat. Cardiovascular exercise helps improve heart health, metabolism, hormonal balance, blood sugar control, and recovery from fatigue. Resistance training improves strength, endurance, balance, coordination, reaction time, metabolism, and muscle mass. Flexibility exercise improves circulation, posture, and balance. Seek guidance from your physician and exercise physiologist before implementing an exercise routine and learn your capabilities and proper form for all exercise.  Introduction to Yoga  Clinical staff conducted group or  individual video education with verbal and written material and guidebook.  Patient learns about yoga, a discipline of the coming together of mind, breath, and body. The benefits of yoga include improved flexibility, improved range of motion, better posture and core strength, increased lung function, weight loss, and positive self-image. Yoga's heart health benefits include lowered blood pressure, healthier heart rate, decreased cholesterol and triglyceride levels, improved immune function, and reduced stress. Seek guidance from your physician and exercise physiologist before implementing an exercise routine and learn your capabilities and proper form for all exercise.  Medical   Aging: Enhancing Your Quality of Life  Clinical staff conducted group or individual video education with verbal and written material and guidebook.  Patient learns key strategies and recommendations to stay in good physical health and enhance quality of life, such as prevention strategies, having an advocate, securing a Health Care Proxy and Power of Attorney, and keeping a list of medications and system for tracking them. It also discusses how to avoid risk for bone loss.  Biology of Weight Control  Clinical staff conducted group or individual video education with verbal and written material and guidebook.  Patient learns that weight gain occurs because we consume more calories than we burn (eating more, moving less). Even if your body weight is normal, you may have higher ratios of fat compared to muscle mass. Too much body fat puts you at increased risk for cardiovascular disease, heart attack, stroke, type 2 diabetes, and obesity-related cancers. In addition to exercise, following the Pritikin Eating Plan can help reduce your risk.  Decoding Lab Results  Clinical staff conducted group or individual video education with verbal and written material and guidebook.  Patient learns that lab test reflects one measurement whose  values change over time and are influenced by many factors, including medication, stress, sleep, exercise, food, hydration, pre-existing medical conditions, and more. It is recommended to use the knowledge from this video to become more involved with your lab results and evaluate your numbers to speak with your doctor.   Diseases of Our Time - Overview  Clinical staff conducted group or individual video education with verbal and written material and guidebook.  Patient learns that according to the CDC, 50% to 70% of chronic diseases (such as obesity, type 2 diabetes, elevated lipids, hypertension, and heart disease) are avoidable through lifestyle improvements including healthier food choices, listening to satiety cues, and increased physical activity.  Sleep Disorders Clinical staff conducted group or individual video education with verbal and written material and guidebook.  Patient learns how good quality and duration of sleep are important to overall health and well-being. Patient also  learns about sleep disorders and how they impact health along with recommendations to address them, including discussing with a physician.  Nutrition  Dining Out - Part 2 Clinical staff conducted group or individual video education with verbal and written material and guidebook.  Patient learns how to plan ahead and communicate in order to maximize their dining experience in a healthy and nutritious manner. Included are recommended food choices based on the type of restaurant the patient is visiting.   Fueling a Banker conducted group or individual video education with verbal and written material and guidebook.  There is a strong connection between our food choices and our health. Diseases like obesity and type 2 diabetes are very prevalent and are in large-part due to lifestyle choices. The Pritikin Eating Plan provides plenty of food and hunger-curbing satisfaction. It is easy to follow,  affordable, and helps reduce health risks.  Menu Workshop  Clinical staff conducted group or individual video education with verbal and written material and guidebook.  Patient learns that restaurant meals can sabotage health goals because they are often packed with calories, fat, sodium, and sugar. Recommendations include strategies to plan ahead and to communicate with the manager, chef, or server to help order a healthier meal.  Planning Your Eating Strategy  Clinical staff conducted group or individual video education with verbal and written material and guidebook.  Patient learns about the Pritikin Eating Plan and its benefit of reducing the risk of disease. The Pritikin Eating Plan does not focus on calories. Instead, it emphasizes high-quality, nutrient-rich foods. By knowing the characteristics of the foods, we choose, we can determine their calorie density and make informed decisions.  Targeting Your Nutrition Priorities  Clinical staff conducted group or individual video education with verbal and written material and guidebook.  Patient learns that lifestyle habits have a tremendous impact on disease risk and progression. This video provides eating and physical activity recommendations based on your personal health goals, such as reducing LDL cholesterol, losing weight, preventing or controlling type 2 diabetes, and reducing high blood pressure.  Vitamins and Minerals  Clinical staff conducted group or individual video education with verbal and written material and guidebook.  Patient learns different ways to obtain key vitamins and minerals, including through a recommended healthy diet. It is important to discuss all supplements you take with your doctor.   Healthy Mind-Set    Smoking Cessation  Clinical staff conducted group or individual video education with verbal and written material and guidebook.  Patient learns that cigarette smoking and tobacco addiction pose a serious health  risk which affects millions of people. Stopping smoking will significantly reduce the risk of heart disease, lung disease, and many forms of cancer. Recommended strategies for quitting are covered, including working with your doctor to develop a successful plan.  Culinary   Becoming a Set designer conducted group or individual video education with verbal and written material and guidebook.  Patient learns that cooking at home can be healthy, cost-effective, quick, and puts them in control. Keys to cooking healthy recipes will include looking at your recipe, assessing your equipment needs, planning ahead, making it simple, choosing cost-effective seasonal ingredients, and limiting the use of added fats, salts, and sugars.  Cooking - Breakfast and Snacks  Clinical staff conducted group or individual video education with verbal and written material and guidebook.  Patient learns how important breakfast is to satiety and nutrition through the entire day. Recommendations include key foods  to eat during breakfast to help stabilize blood sugar levels and to prevent overeating at meals later in the day. Planning ahead is also a key component.  Cooking - Educational psychologist conducted group or individual video education with verbal and written material and guidebook.  Patient learns eating strategies to improve overall health, including an approach to cook more at home. Recommendations include thinking of animal protein as a side on your plate rather than center stage and focusing instead on lower calorie dense options like vegetables, fruits, whole grains, and plant-based proteins, such as beans. Making sauces in large quantities to freeze for later and leaving the skin on your vegetables are also recommended to maximize your experience.  Cooking - Healthy Salads and Dressing Clinical staff conducted group or individual video education with verbal and written material and  guidebook.  Patient learns that vegetables, fruits, whole grains, and legumes are the foundations of the Pritikin Eating Plan. Recommendations include how to incorporate each of these in flavorful and healthy salads, and how to create homemade salad dressings. Proper handling of ingredients is also covered. Cooking - Soups and [employer] - Soups and Desserts Clinical staff conducted group or individual video education with verbal and written material and guidebook.  Patient learns that Pritikin soups and desserts make for easy, nutritious, and delicious snacks and meal components that are low in sodium, fat, sugar, and calorie density, while high in vitamins, minerals, and filling fiber. Recommendations include simple and healthy ideas for soups and desserts.   Overview     The Pritikin Solution Program Overview Clinical staff conducted group or individual video education with verbal and written material and guidebook.  Patient learns that the results of the Pritikin Program have been documented in more than 100 articles published in peer-reviewed journals, and the benefits include reducing risk factors for (and, in some cases, even reversing) high cholesterol, high blood pressure, type 2 diabetes, obesity, and more! An overview of the three key pillars of the Pritikin Program will be covered: eating well, doing regular exercise, and having a healthy mind-set.  WORKSHOPS  Exercise: Exercise Basics: Building Your Action Plan Clinical staff led group instruction and group discussion with PowerPoint presentation and patient guidebook. To enhance the learning environment the use of posters, models and videos may be added. At the conclusion of this workshop, patients will comprehend the difference between physical activity and exercise, as well as the benefits of incorporating both, into their routine. Patients will understand the FITT (Frequency, Intensity, Time, and Type) principle and how to  use it to build an exercise action plan. In addition, safety concerns and other considerations for exercise and cardiac rehab will be addressed by the presenter. The purpose of this lesson is to promote a comprehensive and effective weekly exercise routine in order to improve patients' overall level of fitness.   Managing Heart Disease: Your Path to a Healthier Heart Clinical staff led group instruction and group discussion with PowerPoint presentation and patient guidebook. To enhance the learning environment the use of posters, models and videos may be added.At the conclusion of this workshop, patients will understand the anatomy and physiology of the heart. Additionally, they will understand how Pritikin's three pillars impact the risk factors, the progression, and the management of heart disease.  The purpose of this lesson is to provide a high-level overview of the heart, heart disease, and how the Pritikin lifestyle positively impacts risk factors.  Exercise Biomechanics Clinical staff led  group instruction and group discussion with PowerPoint presentation and patient guidebook. To enhance the learning environment the use of posters, models and videos may be added. Patients will learn how the structural parts of their bodies function and how these functions impact their daily activities, movement, and exercise. Patients will learn how to promote a neutral spine, learn how to manage pain, and identify ways to improve their physical movement in order to promote healthy living. The purpose of this lesson is to expose patients to common physical limitations that impact physical activity. Participants will learn practical ways to adapt and manage aches and pains, and to minimize their effect on regular exercise. Patients will learn how to maintain good posture while sitting, walking, and lifting.  Balance Training and Fall Prevention  Clinical staff led group instruction and group discussion  with PowerPoint presentation and patient guidebook. To enhance the learning environment the use of posters, models and videos may be added. At the conclusion of this workshop, patients will understand the importance of their sensorimotor skills (vision, proprioception, and the vestibular system) in maintaining their ability to balance as they age. Patients will apply a variety of balancing exercises that are appropriate for their current level of function. Patients will understand the common causes for poor balance, possible solutions to these problems, and ways to modify their physical environment in order to minimize their fall risk. The purpose of this lesson is to teach patients about the importance of maintaining balance as they age and ways to minimize their risk of falling.  WORKSHOPS   Nutrition:  Fueling a Ship broker led group instruction and group discussion with PowerPoint presentation and patient guidebook. To enhance the learning environment the use of posters, models and videos may be added. Patients will review the foundational principles of the Pritikin Eating Plan and understand what constitutes a serving size in each of the food groups. Patients will also learn Pritikin-friendly foods that are better choices when away from home and review make-ahead meal and snack options. Calorie density will be reviewed and applied to three nutrition priorities: weight maintenance, weight loss, and weight gain. The purpose of this lesson is to reinforce (in a group setting) the key concepts around what patients are recommended to eat and how to apply these guidelines when away from home by planning and selecting Pritikin-friendly options. Patients will understand how calorie density may be adjusted for different weight management goals.  Mindful Eating  Clinical staff led group instruction and group discussion with PowerPoint presentation and patient guidebook. To enhance the  learning environment the use of posters, models and videos may be added. Patients will briefly review the concepts of the Pritikin Eating Plan and the importance of low-calorie dense foods. The concept of mindful eating will be introduced as well as the importance of paying attention to internal hunger signals. Triggers for non-hunger eating and techniques for dealing with triggers will be explored. The purpose of this lesson is to provide patients with the opportunity to review the basic principles of the Pritikin Eating Plan, discuss the value of eating mindfully and how to measure internal cues of hunger and fullness using the Hunger Scale. Patients will also discuss reasons for non-hunger eating and learn strategies to use for controlling emotional eating.  Targeting Your Nutrition Priorities Clinical staff led group instruction and group discussion with PowerPoint presentation and patient guidebook. To enhance the learning environment the use of posters, models and videos may be added. Patients will learn how  to determine their genetic susceptibility to disease by reviewing their family history. Patients will gain insight into the importance of diet as part of an overall healthy lifestyle in mitigating the impact of genetics and other environmental insults. The purpose of this lesson is to provide patients with the opportunity to assess their personal nutrition priorities by looking at their family history, their own health history and current risk factors. Patients will also be able to discuss ways of prioritizing and modifying the Pritikin Eating Plan for their highest risk areas  Menu  Clinical staff led group instruction and group discussion with PowerPoint presentation and patient guidebook. To enhance the learning environment the use of posters, models and videos may be added. Using menus brought in from [employer], or printed from [employer], patients will apply the Pritikin dining out  guidelines that were presented in the [employer] video. Patients will also be able to practice these guidelines in a variety of provided scenarios. The purpose of this lesson is to provide patients with the opportunity to practice hands-on learning of the Pritikin Dining Out guidelines with actual menus and practice scenarios.  Label Reading Clinical staff led group instruction and group discussion with PowerPoint presentation and patient guidebook. To enhance the learning environment the use of posters, models and videos may be added. Patients will review and discuss the Pritikin label reading guidelines presented in Pritikin's Label Reading Educational series video. Using fool labels brought in from local grocery stores and markets, patients will apply the label reading guidelines and determine if the packaged food meet the Pritikin guidelines. The purpose of this lesson is to provide patients with the opportunity to review, discuss, and practice hands-on learning of the Pritikin Label Reading guidelines with actual packaged food labels. Cooking School  Pritikin's [employer] are designed to teach patients ways to prepare quick, simple, and affordable recipes at home. The importance of nutrition's role in chronic disease risk reduction is reflected in its emphasis in the overall Pritikin program. By learning how to prepare essential core Pritikin Eating Plan recipes, patients will increase control over what they eat; be able to customize the flavor of foods without the use of added salt, sugar, or fat; and improve the quality of the food they consume. By learning a set of core recipes which are easily assembled, quickly prepared, and affordable, patients are more likely to prepare more healthy foods at home. These workshops focus on convenient breakfasts, simple entres, side dishes, and desserts which can be prepared with minimal effort and are consistent with nutrition  recommendations for cardiovascular risk reduction. Cooking [employer] are taught by a Armed forces logistics/support/administrative officer (RD) who has been trained by the [employer]. The chef or RD has a clear understanding of the importance of minimizing - if not completely eliminating - added fat, sugar, and sodium in recipes. Throughout the series of Cooking School Workshop sessions, patients will learn about healthy ingredients and efficient methods of cooking to build confidence in their capability to prepare    Cooking School weekly topics:  Adding Flavor- Sodium-Free  Fast and Healthy Breakfasts  Powerhouse Plant-Based Proteins  Satisfying Salads and Dressings  Simple Sides and Sauces  International Cuisine-Spotlight on the [employer] Zones  Delicious Desserts  Savory Soups  [employer] - Meals in a Astronomer Appetizers and Snacks  Comforting Weekend Breakfasts  One-Pot Wonders   Fast Evening Meals  Easy Entertaining  Personalizing Your Pritikin Plate  WORKSHOPS   Healthy Mindset (Psychosocial):  Focused Goals, Sustainable Changes Clinical staff led group instruction and group discussion with PowerPoint presentation and patient guidebook. To enhance the learning environment the use of posters, models and videos may be added. Patients will be able to apply effective goal setting strategies to establish at least one personal goal, and then take consistent, meaningful action toward that goal. They will learn to identify common barriers to achieving personal goals and develop strategies to overcome them. Patients will also gain an understanding of how our mind-set can impact our ability to achieve goals and the importance of cultivating a positive and growth-oriented mind-set. The purpose of this lesson is to provide patients with a deeper understanding of how to set and achieve personal goals, as well as the tools and strategies needed to overcome common obstacles which may arise along  the way.  From Head to Heart: The Power of a Healthy Outlook  Clinical staff led group instruction and group discussion with PowerPoint presentation and patient guidebook. To enhance the learning environment the use of posters, models and videos may be added. Patients will be able to recognize and describe the impact of emotions and mood on physical health. They will discover the importance of self-care and explore self-care practices which may work for them. Patients will also learn how to utilize the 4 C's to cultivate a healthier outlook and better manage stress and challenges. The purpose of this lesson is to demonstrate to patients how a healthy outlook is an essential part of maintaining good health, especially as they continue their cardiac rehab journey.  Healthy Sleep for a Healthy Heart Clinical staff led group instruction and group discussion with PowerPoint presentation and patient guidebook. To enhance the learning environment the use of posters, models and videos may be added. At the conclusion of this workshop, patients will be able to demonstrate knowledge of the importance of sleep to overall health, well-being, and quality of life. They will understand the symptoms of, and treatments for, common sleep disorders. Patients will also be able to identify daytime and nighttime behaviors which impact sleep, and they will be able to apply these tools to help manage sleep-related challenges. The purpose of this lesson is to provide patients with a general overview of sleep and outline the importance of quality sleep. Patients will learn about a few of the most common sleep disorders. Patients will also be introduced to the concept of "sleep hygiene," and discover ways to self-manage certain sleeping problems through simple daily behavior changes. Finally, the workshop will motivate patients by clarifying the links between quality sleep and their goals of heart-healthy living.   Recognizing and  Reducing Stress Clinical staff led group instruction and group discussion with PowerPoint presentation and patient guidebook. To enhance the learning environment the use of posters, models and videos may be added. At the conclusion of this workshop, patients will be able to understand the types of stress reactions, differentiate between acute and chronic stress, and recognize the impact that chronic stress has on their health. They will also be able to apply different coping mechanisms, such as reframing negative self-talk. Patients will have the opportunity to practice a variety of stress management techniques, such as deep abdominal breathing, progressive muscle relaxation, and/or guided imagery.  The purpose of this lesson is to educate patients on the role of stress in their lives and to provide healthy techniques for coping with it.  Learning Barriers/Preferences:  Learning Barriers/Preferences - 06/18/24 1520  Learning Barriers/Preferences   Learning Barriers None    Learning Preferences Audio;Computer/Internet;Individual Instruction;Group Instruction;Pictoral;Skilled Demonstration;Verbal Instruction;Video;Written Material          Education Topics:  Knowledge Questionnaire Score:  Knowledge Questionnaire Score - 06/18/24 1520       Knowledge Questionnaire Score   Pre Score 23/24          Core Components/Risk Factors/Patient Goals at Admission:  Personal Goals and Risk Factors at Admission - 06/18/24 1522       Core Components/Risk Factors/Patient Goals on Admission    Weight Management Yes;Weight Maintenance    Intervention Weight Management: Provide education and appropriate resources to help participant work on and attain dietary goals.;Weight Management: Develop a combined nutrition and exercise program designed to reach desired caloric intake, while maintaining appropriate intake of nutrient and fiber, sodium and fats, and appropriate energy expenditure required for  the weight goal.    Expected Outcomes Short Term: Continue to assess and modify interventions until short term weight is achieved;Long Term: Adherence to nutrition and physical activity/exercise program aimed toward attainment of established weight goal;Weight Maintenance: Understanding of the daily nutrition guidelines, which includes 25-35% calories from fat, 7% or less cal from saturated fats, less than 200mg  cholesterol, less than 1.5gm of sodium, & 5 or more servings of fruits and vegetables daily;Understanding of distribution of calorie intake throughout the day with the consumption of 4-5 meals/snacks;Understanding recommendations for meals to include 15-35% energy as protein, 25-35% energy from fat, 35-60% energy from carbohydrates, less than 200mg  of dietary cholesterol, 20-35 gm of total fiber daily    Hypertension Yes    Intervention Provide education on lifestyle modifcations including regular physical activity/exercise, weight management, moderate sodium restriction and increased consumption of fresh fruit, vegetables, and low fat dairy, alcohol moderation, and smoking cessation.;Monitor prescription use compliance.    Expected Outcomes Short Term: Continued assessment and intervention until BP is < 140/72mm HG in hypertensive participants. < 130/77mm HG in hypertensive participants with diabetes, heart failure or chronic kidney disease.;Long Term: Maintenance of blood pressure at goal levels.    Lipids Yes    Intervention Provide education and support for participant on nutrition & aerobic/resistive exercise along with prescribed medications to achieve LDL 70mg , HDL >40mg .    Expected Outcomes Short Term: Participant states understanding of desired cholesterol values and is compliant with medications prescribed. Participant is following exercise prescription and nutrition guidelines.;Long Term: Cholesterol controlled with medications as prescribed, with individualized exercise RX and with  personalized nutrition plan. Value goals: LDL < 70mg , HDL > 40 mg.          Core Components/Risk Factors/Patient Goals Review:    Core Components/Risk Factors/Patient Goals at Discharge (Final Review):    ITP Comments:  ITP Comments     Row Name 06/18/24 1318           ITP Comments Dr. Wilbert Bihari medical director. Introduction to pritikin education/intensive cardiac rehab. Initial orientation packet reviewed with patient.          Comments: Participant attended orientation for the cardiac rehabilitation program on  06/18/2024  to perform initial intake and exercise walk test. Patient introduced to the Pritikin Program education and orientation packet was reviewed. Completed 6-minute walk test, measurements, initial ITP, and exercise prescription. Vital signs stable. Telemetry-normal sinus rhythm, asymptomatic.   Service time was from 1319 to 1451.  Con KATHEE Pereyra, MS, ACSM-CEP 06/18/2024 3:24 PM

## 2024-06-18 NOTE — Progress Notes (Signed)
 Cardiac Rehab Medication Review   Does the patient  feel that his/her medications are working for him/her? Yes    Has the patient been experiencing any side effects to the medications prescribed? No  Does the patient measure his/her own blood pressure or blood glucose at home?   No  Does the patient have any problems obtaining medications due to transportation or finances?   No  Understanding of regimen: good Understanding of indications: good Potential of compliance: good    Comments: Clearence understands his medications and regime well. He does not have a BP cuff at home, encouraged to get one if able.    Con KATHEE Pereyra, MS, ACSM-CEP 06/18/2024 3:16 PM

## 2024-06-22 ENCOUNTER — Other Ambulatory Visit: Payer: Self-pay | Admitting: Physician Assistant

## 2024-06-23 ENCOUNTER — Encounter (HOSPITAL_COMMUNITY)
Admission: RE | Admit: 2024-06-23 | Discharge: 2024-06-23 | Disposition: A | Discharge status: Home / Self Care | Source: Ambulatory Visit | Attending: Cardiology | Admitting: Cardiology

## 2024-06-23 ENCOUNTER — Other Ambulatory Visit: Payer: Self-pay | Admitting: Cardiology

## 2024-06-23 DIAGNOSIS — Z951 Presence of aortocoronary bypass graft: Secondary | ICD-10-CM | POA: Diagnosis not present

## 2024-06-23 NOTE — Progress Notes (Signed)
 Daily Session Note  Patient Details  Name: Gary Castaneda MRN: [id number] Date of Birth: [birth year] Referring Provider:   Flowsheet Row INTENSIVE CARDIAC REHAB ORIENT from 06/18/2024 in [facility]  Referring Provider Gary Parchment, MD    Encounter Date: 06/23/2024  Check In:  Session Check In - 06/23/24 1530       Check-In   Supervising physician immediately available to respond to emergencies [facility] MD immediately available    Physician(s) Gary Hila, NP    Location [facility]    Staff Present Gary Castaneda BS, ACSM-CEP, Exercise Physiologist;Gary Janann, MS, ACSM-CEP, CCRP, Exercise Physiologist;Other;Gary Pond, RN, BSN    Virtual Visit No    Medication changes reported     No    Fall or balance concerns reported    No    Tobacco Cessation No Change    Warm-up and Cool-down Performed as group-led instruction   CRP2 orientation   Resistance Training Performed Yes    VAD Patient? No    PAD/SET Patient? No      Pain Assessment   Currently in Pain? No/denies    Pain Score 0-No pain    Multiple Pain Sites No          Capillary Blood Glucose: No results found for this or any previous visit (from the past 24 hours).   Exercise Prescription Changes - 06/23/24 1646       Response to Exercise   Blood Pressure (Admit) 106/62    Blood Pressure (Exercise) 106/70    Blood Pressure (Exit) 104/60    Heart Rate (Admit) 87 bpm    Heart Rate (Exercise) 118 bpm    Heart Rate (Exit) 94 bpm    Rating of Perceived Exertion (Exercise) 9.5    Perceived Dyspnea (Exercise) 0    Symptoms None    Comments Pt first day int eh [facility] program.    Duration Progress to 30 minutes of  aerobic without signs/symptoms of physical distress    Intensity THRR unchanged      Progression   Progression Continue to progress workloads to maintain intensity without signs/symptoms of physical distress.     Average METs 3.15      Resistance Training   Training Prescription Yes    Weight 3    Reps 10-15    Time 10 Minutes      Treadmill   MPH 3    Grade 0    Minutes 15    METs 3.3      Recumbant Elliptical   Level 1    RPM 40    Watts 53    Minutes 15    METs 3          Social History   Tobacco Use  Smoking Status Never  Smokeless Tobacco Never    Goals Met:  Exercise tolerated well No report of concerns or symptoms today Strength training completed today  Goals Unmet:  Not Applicable  Comments: Pt started cardiac rehab today.  Pt tolerated light exercise without difficulty. VSS, telemetry-Sinus rhythm, asymptomatic.  Medication list reconciled. Small scab noted at the top of Gary Castaneda's mid sternal incision.Area is dry no drainage noted.  Pt denies barriers to medicaiton compliance.  PSYCHOSOCIAL ASSESSMENT:  PHQ-0. Pt exhibits positive coping skills, hopeful outlook with supportive family. No psychosocial needs identified at this time, no psychosocial interventions necessary.    Pt enjoys spending time with wife and kid, dog  and cat, exercising and sports.   Pt oriented to exercise equipment and routine.    Understanding verbalized. Gary Elpidio Quan RN BSN    Dr. Wilbert Castaneda is Medical Director for Cardiac Rehab at [facility].

## 2024-06-25 ENCOUNTER — Encounter (HOSPITAL_COMMUNITY)
Admission: RE | Admit: 2024-06-25 | Discharge: 2024-06-25 | Discharge status: Home / Self Care | Source: Ambulatory Visit | Attending: Cardiology

## 2024-06-25 DIAGNOSIS — Z951 Presence of aortocoronary bypass graft: Secondary | ICD-10-CM | POA: Diagnosis not present

## 2024-06-27 ENCOUNTER — Encounter (HOSPITAL_COMMUNITY)
Admission: RE | Admit: 2024-06-27 | Discharge: 2024-06-27 | Disposition: A | Discharge status: Home / Self Care | Source: Ambulatory Visit | Attending: Cardiology | Admitting: Cardiology

## 2024-06-27 ENCOUNTER — Other Ambulatory Visit (HOSPITAL_COMMUNITY): Payer: Self-pay

## 2024-06-27 DIAGNOSIS — Z951 Presence of aortocoronary bypass graft: Secondary | ICD-10-CM | POA: Insufficient documentation

## 2024-06-27 DIAGNOSIS — Z48812 Encounter for surgical aftercare following surgery on the circulatory system: Secondary | ICD-10-CM | POA: Diagnosis not present

## 2024-06-30 ENCOUNTER — Encounter (HOSPITAL_COMMUNITY)
Admission: RE | Admit: 2024-06-30 | Discharge: 2024-06-30 | Disposition: A | Discharge status: Home / Self Care | Source: Ambulatory Visit | Attending: Cardiology

## 2024-06-30 DIAGNOSIS — Z951 Presence of aortocoronary bypass graft: Secondary | ICD-10-CM | POA: Diagnosis not present

## 2024-06-30 DIAGNOSIS — Z48812 Encounter for surgical aftercare following surgery on the circulatory system: Secondary | ICD-10-CM | POA: Diagnosis not present

## 2024-07-02 ENCOUNTER — Encounter (HOSPITAL_COMMUNITY)
Admission: RE | Admit: 2024-07-02 | Discharge: 2024-07-02 | Disposition: A | Discharge status: Home / Self Care | Source: Ambulatory Visit | Attending: Cardiology

## 2024-07-02 DIAGNOSIS — Z951 Presence of aortocoronary bypass graft: Secondary | ICD-10-CM

## 2024-07-02 DIAGNOSIS — Z48812 Encounter for surgical aftercare following surgery on the circulatory system: Secondary | ICD-10-CM | POA: Diagnosis not present

## 2024-07-04 ENCOUNTER — Encounter (HOSPITAL_COMMUNITY)
Admission: RE | Admit: 2024-07-04 | Discharge: 2024-07-04 | Disposition: A | Discharge status: Home / Self Care | Source: Ambulatory Visit | Attending: Cardiology | Admitting: Cardiology

## 2024-07-04 DIAGNOSIS — Z48812 Encounter for surgical aftercare following surgery on the circulatory system: Secondary | ICD-10-CM | POA: Diagnosis not present

## 2024-07-04 DIAGNOSIS — Z951 Presence of aortocoronary bypass graft: Secondary | ICD-10-CM

## 2024-07-04 NOTE — Progress Notes (Signed)
 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  Patient's Home Medications on Admission:  Current Outpatient Medications:    amLODipine  (NORVASC ) 5 MG tablet, TAKE 1 TABLET (5MG ) BY MOUTH DAILY., Disp: 30 tablet, Rfl: 3   Ascorbic Acid (VITAMIN C PO), Take 1 tablet by mouth daily., Disp: , Rfl:    aspirin  EC (QC ENTERIC ASPIRIN ) 325 MG tablet, TAKE 1 TABLET (325MG ) BY MOUTH DAILY., Disp: 30 tablet, Rfl: 3   Cholecalciferol (D3 PO), Take 1 tablet by mouth daily., Disp: , Rfl:    Evolocumab  with Infusor (REPATHA  PUSHTRONEX SYSTEM) 420 MG/3.5ML SOCT, Inject 420 mg into the skin every 30 (thirty) days., Disp: 3.5 mL, Rfl: 3   ezetimibe  (ZETIA ) 10 MG tablet, Take 10 mg by mouth daily., Disp: , Rfl:    Ferrous Sulfate (IRON PO), Take 1 tablet by mouth daily., Disp: , Rfl:    Menaquinone-7 (K2 PO), Take 1 tablet by mouth daily., Disp: , Rfl:    metoprolol  tartrate (LOPRESSOR ) 25 MG tablet, Take 0.5 tablets (12.5 mg total) by mouth 2 (two) times daily., Disp: 60 tablet, Rfl: 1   potassium chloride  SA (KLOR-CON  M) 20 MEQ tablet, Take 1 tablet (20 mEq total) by mouth 2 (two) times daily for 1 day. (Patient not taking: Reported on 06/18/2024), Disp: 2 tablet, Rfl: 0   REPATHA  SURECLICK 140 MG/ML SOAJ, Inject 140 mg into the skin every 14 (fourteen) days., Disp: 2 mL, Rfl: 0   rosuvastatin  (CRESTOR ) 20 MG tablet, TAKE (1) TABLET DAILY AT BEDTIME., Disp: 90 tablet, Rfl: 0  Past Medical History: Past Medical History:   Diagnosis Date   Coronary artery disease    HTN (hypertension)    Hyperlipidemia LDL goal < 100     Tobacco Use: Social History   Tobacco Use  Smoking Status Never  Smokeless Tobacco Never    Labs: Review Flowsheet       Latest Ref Rng & Units 04/22/2024 04/24/2024  Labs for ITP Cardiac and Pulmonary Rehab  Hemoglobin A1c 4.8 - 5.6 % 4.9  -  PH, Arterial 7.35 - 7.45 - 7.327  7.408  7.462  7.453  7.440   PCO2 arterial 32 - 48 mmHg - 46.1  28.3  33.9  32.9  36.7   Bicarbonate 20.0 - 28.0 mmol/L - 24.5  18.0  24.2  25.3  23.0  24.9   TCO2 22 - 32 mmol/L - 26  19  25  26  27  26  24  28  25  26    Acid-base deficit 0.0 - 2.0 mmol/L - 2.0  6.0  1.0   O2 Saturation % - 98  99  100  89  100  100     Details       Multiple values from one day are sorted in reverse-chronological order         Capillary Blood Glucose: Lab Results  Component Value Date   GLUCAP 118 (H) 04/26/2024   GLUCAP 120 (H) 04/26/2024   GLUCAP 111 (H) 04/26/2024  GLUCAP 136 (H) 04/26/2024   GLUCAP 111 (H) 04/26/2024     Exercise Target Goals: Exercise Program Goal: Individual exercise prescription set using results from initial 6 min walk test and THRR while considering  patient's activity barriers and safety.   Exercise Prescription Goal: Initial exercise prescription builds to 30-45 minutes a day of aerobic activity, 2-3 days per week.  Home exercise guidelines will be given to patient during program as part of exercise prescription that the participant will acknowledge.  Activity Barriers & Risk Stratification:  Activity Barriers & Cardiac Risk Stratification - 06/18/24 1517       Activity Barriers & Cardiac Risk Stratification   Activity Barriers Incisional Pain;Other (comment)    Comments sternal precautions    Cardiac Risk Stratification High   <5 METs on         6 Minute Walk:  6 Minute Walk     Row Name 06/18/24 1517         6 Minute Walk   Phase Initial      Distance 1340 feet     Walk Time 6 minutes     # of Rest Breaks 0     MPH 2.54     METS 1.06     RPE 9.5     Perceived Dyspnea  0     VO2 Peak 14.21     Symptoms No     Resting HR 68 bpm     Resting BP 104/68     Resting Oxygen Saturation  98 %     Exercise Oxygen Saturation  during 6 min walk 98 %     Max Ex. HR 79 bpm     Max Ex. BP 130/62     2 Minute Post BP 100/72        Oxygen Initial Assessment:   Oxygen Re-Evaluation:   Oxygen Discharge (Final Oxygen Re-Evaluation):   Initial Exercise Prescription:  Initial Exercise Prescription - 06/18/24 1500       Date of Initial Exercise RX and Referring Provider   Date 06/18/24    Referring Provider [name], MD    Expected Discharge Date 09/10/24      Treadmill   MPH 3    Grade 0    Minutes 15    METs 2.5      Recumbant Elliptical   Level 1    RPM 60    Watts 70    Minutes 15    METs 2.5      Prescription Details   Frequency (times per week) 3    Duration Progress to 30 minutes of continuous aerobic without signs/symptoms of physical distress      Intensity   THRR 40-80% of Max Heartrate 68-136    Ratings of Perceived Exertion 11-13    Perceived Dyspnea 0-4      Progression   Progression Continue progressive overload as per policy without signs/symptoms or physical distress.      Resistance Training   Training Prescription Yes    Weight 3    Reps 10-15          Perform Capillary Blood Glucose checks as needed.  Exercise Prescription Changes:   Exercise Prescription Changes     Row Name 06/23/24 1646             Response to Exercise   Blood Pressure (Admit) 106/62       Blood Pressure (Exercise) 106/70       Blood Pressure (Exit)  104/60       Heart Rate (Admit) 87 bpm       Heart Rate (Exercise) 118 bpm       Heart Rate (Exit) 94 bpm       Rating of Perceived Exertion (Exercise) 9.5       Perceived Dyspnea (Exercise) 0       Symptoms None       Comments Pt first day int eh  Pritikin ICR program.       Duration Progress to 30 minutes of  aerobic without signs/symptoms of physical distress       Intensity THRR unchanged         Progression   Progression Continue to progress workloads to maintain intensity without signs/symptoms of physical distress.       Average METs 3.15         Resistance Training   Training Prescription Yes       Weight 3       Reps 10-15       Time 10 Minutes         Treadmill   MPH 3       Grade 0       Minutes 15       METs 3.3         Recumbant Elliptical   Level 1       RPM 40       Watts 53       Minutes 15       METs 3          Exercise Comments:   Exercise Comments     Row Name 06/23/24 1650           Exercise Comments Pt first day in the Pritikin ICR program. Pt tolerated exercise well with an average MEt level of 3.15. Pt is off to a good start and is learning his THRR, RPE and ExRx`          Exercise Goals and Review:   Exercise Goals     Row Name 06/18/24 1519             Exercise Goals   Increase Physical Activity Yes       Intervention Provide advice, education, support and counseling about physical activity/exercise needs.;Develop an individualized exercise prescription for aerobic and resistive training based on initial evaluation findings, risk stratification, comorbidities and participant's personal goals.       Expected Outcomes Long Term: Add in home exercise to make exercise part of routine and to increase amount of physical activity.;Long Term: Exercising regularly at least 3-5 days a week.;Short Term: Attend rehab on a regular basis to increase amount of physical activity.       Increase Strength and Stamina Yes       Intervention Develop an individualized exercise prescription for aerobic and resistive training based on initial evaluation findings, risk stratification, comorbidities and participant's personal goals.;Provide advice, education, support and counseling about physical  activity/exercise needs.       Expected Outcomes Short Term: Increase workloads from initial exercise prescription for resistance, speed, and METs.;Long Term: Improve cardiorespiratory fitness, muscular endurance and strength as measured by increased METs and functional capacity ( );Short Term: Perform resistance training exercises routinely during rehab and add in resistance training at home       Able to understand and use rate of perceived exertion (RPE) scale Yes       Intervention Provide education and explanation on how to  use RPE scale       Expected Outcomes Short Term: Able to use RPE daily in rehab to express subjective intensity level;Long Term:  Able to use RPE to guide intensity level when exercising independently       Knowledge and understanding of Target Heart Rate Range (THRR) Yes       Intervention Provide education and explanation of THRR including how the numbers were predicted and where they are located for reference       Expected Outcomes Long Term: Able to use THRR to govern intensity when exercising independently;Short Term: Able to state/look up THRR;Short Term: Able to use daily as guideline for intensity in rehab       Understanding of Exercise Prescription Yes       Intervention Provide education, explanation, and written materials on patient's individual exercise prescription       Expected Outcomes Short Term: Able to explain program exercise prescription;Long Term: Able to explain home exercise prescription to exercise independently          Exercise Goals Re-Evaluation :  Exercise Goals Re-Evaluation     Row Name 06/23/24 1649             Exercise Goal Re-Evaluation   Exercise Goals Review Increase Physical Activity;Understanding of Exercise Prescription;Increase Strength and Stamina;Knowledge and understanding of Target Heart Rate Range (THRR);Able to understand and use rate of perceived exertion (RPE) scale       Comments Pt first day in the Prit[facility]in ICR  program. Pt tolerated exercise well with an average MEt level of 3.15. Pt is off to a good start and is learning his THRR, RPE and ExRx`       Expected Outcomes Will continue to monitor pt anf progress workloads as tolerated without sign or symptom          Discharge Exercise Prescription (Final Exercise Prescription Changes):  Exercise Prescription Changes - 06/23/24 1646       Response to Exercise   Blood Pressure (Admit) 106/62    Blood Pressure (Exercise) 106/70    Blood Pressure (Exit) 104/60    Heart Rate (Admit) 87 bpm    Heart Rate (Exercise) 118 bpm    Heart Rate (Exit) 94 bpm    Rating of Perceived Exertion (Exercise) 9.5    Perceived Dyspnea (Exercise) 0    Symptoms None    Comments Pt first day int eh [facility] ICR program.    Duration Progress to 30 minutes of  aerobic without signs/symptoms of physical distress    Intensity THRR unchanged      Progression   Progression Continue to progress workloads to maintain intensity without signs/symptoms of physical distress.    Average METs 3.15      Resistance Training   Training Prescription Yes    Weight 3    Reps 10-15    Time 10 Minutes      Treadmill   MPH 3    Grade 0    Minutes 15    METs 3.3      Recumbant Elliptical   Level 1    RPM 40    Watts 53    Minutes 15    METs 3          Nutrition:  Target Goals: Understanding of nutrition guidelines, daily intake of sodium 1500mg , cholesterol 200mg , calories 30% from fat and 7% or less from saturated fats, daily to have 5 or more servings of fruits and vegetables.  Biometrics:  Pre  Biometrics - 06/18/24 1508       Pre Biometrics   Waist Circumference 35 inches    Hip Circumference 36 inches    Waist to Hip Ratio 0.97 %    Triceps Skinfold 12 mm    % Body Fat 22.3 %    Grip Strength 24 kg    Flexibility 12.25 in    Single Leg Stand 9.52 seconds           Nutrition Therapy Plan and Nutrition Goals:  Nutrition Therapy & Goals - 06/23/24  1603       Nutrition Therapy   Diet Heart healthy diet    Drug/Food Interactions Statins/Certain Fruits      Personal Nutrition Goals   Nutrition Goal Patient to identify strategies for reducing cardiovascular risk by attending the [facility] education and nutrition series weekly.    Personal Goal #2 Patient to improve diet quality by using the plate method as a guide for meal planning to include lean protein/plant protein, fruits, vegetables, whole grains, nonfat dairy as part of a well-balanced diet.    Comments Patient has medical history of CAD s/p CABG x 4 , HTN. Lipid are at goal; he continues repatha , crestor , zetia . Patient will benefit from participation in intensive cardiac rehab for nutrition education, exercise, and lifestyle modification.      Intervention Plan   Intervention Prescribe, educate and counsel regarding individualized specific dietary modifications aiming towards targeted core components such as weight, hypertension, lipid management, diabetes, heart failure and other comorbidities.;Nutrition handout(s) given to patient.    Expected Outcomes Short Term Goal: Understand basic principles of dietary content, such as calories, fat, sodium, cholesterol and nutrients.;Long Term Goal: Adherence to prescribed nutrition plan.          Nutrition Assessments:  MEDIFICTS Score Key: >=70 Need to make dietary changes  40-70 Heart Healthy Diet <= 40 Therapeutic Level Cholesterol Diet   Flowsheet Row INTENSIVE CARDIAC REHAB ORIENT from 06/18/2024 in [facility] for Heart, Vascular, & Lung Health  Picture Your Plate Total Score on Admission 63   Picture Your Plate Scores: <59 Unhealthy dietary pattern with much room for improvement. 41-50 Dietary pattern unlikely to meet recommendations for good health and room for improvement. 51-60 More healthful dietary pattern, with some room for improvement.  >60 Healthy dietary pattern, although there may be some  specific behaviors that could be improved.    Nutrition Goals Re-Evaluation:  Nutrition Goals Re-Evaluation     Row Name 06/23/24 1603             Goals   Current Weight 145 lb 4.5 oz (65.9 kg)       Comment LDL 40, HDL 58, A1c WNL       Expected Outcome Patient has medical history of CAD s/p CABG x 4 , HTN. Lipid are at goal; he continues repatha , crestor , zetia . Patient will benefit from participation in intensive cardiac rehab for nutrition education, exercise, and lifestyle modification.          Nutrition Goals Re-Evaluation:  Nutrition Goals Re-Evaluation     Row Name 06/23/24 1603             Goals   Current Weight 145 lb 4.5 oz (65.9 kg)       Comment LDL 40, HDL 58, A1c WNL       Expected Outcome Patient has medical history of CAD s/p CABG x 4 , HTN. Lipid are at goal; he continues repatha , crestor ,  zetia . Patient will benefit from participation in intensive cardiac rehab for nutrition education, exercise, and lifestyle modification.          Nutrition Goals Discharge (Final Nutrition Goals Re-Evaluation):  Nutrition Goals Re-Evaluation - 06/23/24 1603       Goals   Current Weight 145 lb 4.5 oz (65.9 kg)    Comment LDL 40, HDL 58, A1c WNL    Expected Outcome Patient has medical history of CAD s/p CABG x 4 , HTN. Lipid are at goal; he continues repatha , crestor , zetia . Patient will benefit from participation in intensive cardiac rehab for nutrition education, exercise, and lifestyle modification.          Psychosocial: Target Goals: Acknowledge presence or absence of significant depression and/or stress, maximize coping skills, provide positive support system. Participant is able to verbalize types and ability to use techniques and skills needed for reducing stress and depression.  Initial Review & Psychosocial Screening:  Initial Psych Review & Screening - 06/18/24 1520       Initial Review   Current issues with None Identified      Family Dynamics    Good Support System? Yes   wife     Barriers   Psychosocial barriers to participate in program There are no identifiable barriers or psychosocial needs.      Screening Interventions   Interventions Provide feedback about the scores to participant;Encouraged to exercise    Expected Outcomes Long Term goal: The participant improves quality of Life and PHQ9 Scores as seen by post scores and/or verbalization of changes;Short Term goal: Identification and review with participant of any Quality of Life or Depression concerns found by scoring the questionnaire.          Quality of Life Scores:  Quality of Life - 06/18/24 1520       Quality of Life   Select Quality of Life      Quality of Life Scores   Health/Function Pre 19.8 %    Socioeconomic Pre 25.36 %    Psych/Spiritual Pre 21.93 %    Family Pre 25.2 %    GLOBAL Pre 22.18 %         Scores of 19 and below usually indicate a poorer quality of life in these areas.  A difference of  2-3 points is a clinically meaningful difference.  A difference of 2-3 points in the total score of the Quality of Life Index has been associated with significant improvement in overall quality of life, self-image, physical symptoms, and general health in studies assessing change in quality of life.  PHQ-9: Review Flowsheet       06/18/2024  Depression screen PHQ 2/9  Decreased Interest 0  Down, Depressed, Hopeless 0  PHQ - 2 Score 0  Altered sleeping 0  Tired, decreased energy 0  Change in appetite 0  Feeling bad or failure about yourself  0  Trouble concentrating 0  Moving slowly or fidgety/restless 0  Suicidal thoughts 0  PHQ-9 Score 0   Interpretation of Total Score  Total Score Depression Severity:  1-4 = Minimal depression, 5-9 = Mild depression, 10-14 = Moderate depression, 15-19 = Moderately severe depression, 20-27 = Severe depression   Psychosocial Evaluation and Intervention:   Psychosocial Re-Evaluation:  Psychosocial  Re-Evaluation     Row Name 06/24/24 9177 07/04/24 9170           Psychosocial Re-Evaluation   Current issues with None Identified None Identified      Interventions Encouraged  to attend Cardiac Rehabilitation for the exercise Encouraged to attend Cardiac Rehabilitation for the exercise      Continue Psychosocial Services  No Follow up required No Follow up required         Psychosocial Discharge (Final Psychosocial Re-Evaluation):  Psychosocial Re-Evaluation - 07/04/24 0829       Psychosocial Re-Evaluation   Current issues with None Identified    Interventions Encouraged to attend Cardiac Rehabilitation for the exercise    Continue Psychosocial Services  No Follow up required          Vocational Rehabilitation: Provide vocational rehab assistance to qualifying candidates.   Vocational Rehab Evaluation & Intervention:  Vocational Rehab - 06/18/24 1521       Initial Vocational Rehab Evaluation & Intervention   Assessment shows need for Vocational Rehabilitation No   working         Education: Education Goals: Education classes will be provided on a weekly basis, covering required topics. Participant will state understanding/return demonstration of topics presented.    Education     Row Name 06/23/24 1500     Education   Cardiac Education Topics Pritikin   Nurse, children's Exercise Physiologist   Select Psychosocial   Psychosocial Healthy Minds, Bodies, Hearts   Instruction Review Code 1- Verbalizes Understanding   Class Start Time 1415   Class Stop Time 1450   Class Time Calculation (min) 35 min    Row Name 06/25/24 1400     Education   Cardiac Education Topics Pritikin   Customer service manager   Weekly Topic Adding Flavor - Sodium-Free   Instruction Review Code 1- Verbalizes Understanding   Class Start Time 1345   Class Stop Time 1435   Class Time Calculation (min) 50 min     Row Name 06/27/24 1400     Education   Cardiac Education Topics Pritikin   Select Workshops     Workshops   Educator Exercise Physiologist   Select Exercise   Exercise Workshop Location manager and Fall Prevention   Instruction Review Code 1- Verbalizes Understanding   Class Start Time 1402   Class Stop Time 1441   Class Time Calculation (min) 39 min    Row Name 06/30/24 1500     Education   Cardiac Education Topics Pritikin   Glass blower/designer Nutrition   Nutrition Workshop Label Reading   Instruction Review Code 1- Tax inspector   Class Start Time 1400   Class Stop Time 1450   Class Time Calculation (min) 50 min    Row Name 07/02/24 1500     Education   Cardiac Education Topics Pritikin   Customer service manager   Weekly Topic Fast and Healthy Breakfasts   Instruction Review Code 1- Verbalizes Understanding   Class Start Time 1400   Class Stop Time 1440   Class Time Calculation (min) 40 min    Row Name 07/04/24 1500     Education   Cardiac Education Topics Pritikin   Nurse, children's   Educator Dietitian   Select Nutrition   Nutrition Other   Instruction Review Code 1- Verbalizes Understanding   Class Start Time 1400   Class Stop Time 1445   Class Time Calculation (  min) 45 min      Core Videos: Exercise    Move It!  Clinical staff conducted group or individual video education with verbal and written material and guidebook.  Patient learns the recommended Pritikin exercise program. Exercise with the goal of living a long, healthy life. Some of the health benefits of exercise include controlled diabetes, healthier blood pressure levels, improved cholesterol levels, improved heart and lung capacity, improved sleep, and better body composition. Everyone should speak with their doctor before starting or changing an exercise  routine.  Biomechanical Limitations Clinical staff conducted group or individual video education with verbal and written material and guidebook.  Patient learns how biomechanical limitations can impact exercise and how we can mitigate and possibly overcome limitations to have an impactful and balanced exercise routine.  Body Composition Clinical staff conducted group or individual video education with verbal and written material and guidebook.  Patient learns that body composition (ratio of muscle mass to fat mass) is a key component to assessing overall fitness, rather than body weight alone. Increased fat mass, especially visceral belly fat, can put us  at increased risk for metabolic syndrome, type 2 diabetes, heart disease, and even death. It is recommended to combine diet and exercise (cardiovascular and resistance training) to improve your body composition. Seek guidance from your physician and exercise physiologist before implementing an exercise routine.  Exercise Action Plan Clinical staff conducted group or individual video education with verbal and written material and guidebook.  Patient learns the recommended strategies to achieve and enjoy long-term exercise adherence, including variety, self-motivation, self-efficacy, and positive decision making. Benefits of exercise include fitness, good health, weight management, more energy, better sleep, less stress, and overall well-being.  Medical   Heart Disease Risk Reduction Clinical staff conducted group or individual video education with verbal and written material and guidebook.  Patient learns our heart is our most vital organ as it circulates oxygen, nutrients, white blood cells, and hormones throughout the entire body, and carries waste away. Data supports a plant-based eating plan like the Pritikin Program for its effectiveness in slowing progression of and reversing heart disease. The video provides a number of recommendations to  address heart disease.   Metabolic Syndrome and Belly Fat  Clinical staff conducted group or individual video education with verbal and written material and guidebook.  Patient learns what metabolic syndrome is, how it leads to heart disease, and how one can reverse it and keep it from coming back. You have metabolic syndrome if you have 3 of the following 5 criteria: abdominal obesity, high blood pressure, high triglycerides, low HDL cholesterol, and high blood sugar.  Hypertension and Heart Disease Clinical staff conducted group or individual video education with verbal and written material and guidebook.  Patient learns that high blood pressure, or hypertension, is very common in the [place] . Hypertension is largely due to excessive salt intake, but other important risk factors include being overweight, physical inactivity, drinking too much alcohol, smoking, and not eating enough potassium from fruits and vegetables. High blood pressure is a leading risk factor for heart attack, stroke, congestive heart failure, dementia, kidney failure, and premature death. Long-term effects of excessive salt intake include stiffening of the arteries and thickening of heart muscle and organ damage. Recommendations include ways to reduce hypertension and the risk of heart disease.  Diseases of Our Time - Focusing on Diabetes Clinical staff conducted group or individual video education with verbal and written material and guidebook.  Patient learns why the  best way to stop diseases of our time is prevention, through food and other lifestyle changes. Medicine (such as prescription pills and surgeries) is often only a Band-Aid on the problem, not a long-term solution. Most common diseases of our time include obesity, type 2 diabetes, hypertension, heart disease, and cancer. The Pritikin Program is recommended and has been proven to help reduce, reverse, and/or prevent the damaging effects of metabolic  syndrome.  Nutrition   Overview of the Pritikin Eating Plan  Clinical staff conducted group or individual video education with verbal and written material and guidebook.  Patient learns about the Pritikin Eating Plan for disease risk reduction. The Pritikin Eating Plan emphasizes a wide variety of unrefined, minimally-processed carbohydrates, like fruits, vegetables, whole grains, and legumes. Go, Caution, and Stop food choices are explained. Plant-based and lean animal proteins are emphasized. Rationale provided for low sodium intake for blood pressure control, low added sugars for blood sugar stabilization, and low added fats and oils for coronary artery disease risk reduction and weight management.  Calorie Density  Clinical staff conducted group or individual video education with verbal and written material and guidebook.  Patient learns about calorie density and how it impacts the Pritikin Eating Plan. Knowing the characteristics of the food you choose will help you decide whether those foods will lead to weight gain or weight loss, and whether you want to consume more or less of them. Weight loss is usually a side effect of the Pritikin Eating Plan because of its focus on low calorie-dense foods.  Label Reading  Clinical staff conducted group or individual video education with verbal and written material and guidebook.  Patient learns about the Pritikin recommended label reading guidelines and corresponding recommendations regarding calorie density, added sugars, sodium content, and whole grains.  Dining Out - Part 1  Clinical staff conducted group or individual video education with verbal and written material and guidebook.  Patient learns that restaurant meals can be sabotaging because they can be so high in calories, fat, sodium, and/or sugar. Patient learns recommended strategies on how to positively address this and avoid unhealthy pitfalls.  Facts on Fats  Clinical staff conducted  group or individual video education with verbal and written material and guidebook.  Patient learns that lifestyle modifications can be just as effective, if not more so, as many medications for lowering your risk of heart disease. A Pritikin lifestyle can help to reduce your risk of inflammation and atherosclerosis (cholesterol build-up, or plaque, in the artery walls). Lifestyle interventions such as dietary choices and physical activity address the cause of atherosclerosis. A review of the types of fats and their impact on blood cholesterol levels, along with dietary recommendations to reduce fat intake is also included.  Nutrition Action Plan  Clinical staff conducted group or individual video education with verbal and written material and guidebook.  Patient learns how to incorporate Pritikin recommendations into their lifestyle. Recommendations include planning and keeping personal health goals in mind as an important part of their success.  Healthy Mind-Set    Healthy Minds, Bodies, Hearts  Clinical staff conducted group or individual video education with verbal and written material and guidebook.  Patient learns how to identify when they are stressed. Video will discuss the impact of that stress, as well as the many benefits of stress management. Patient will also be introduced to stress management techniques. The way we think, act, and feel has an impact on our hearts.  How Our Thoughts Can Heal Our Hearts  Clinical staff conducted group or individual video education with verbal and written material and guidebook.  Patient learns that negative thoughts can cause depression and anxiety. This can result in negative lifestyle behavior and serious health problems. Cognitive behavioral therapy is an effective method to help control our thoughts in order to change and improve our emotional outlook.  Additional Videos:  Exercise    Improving Performance  Clinical staff conducted group or  individual video education with verbal and written material and guidebook.  Patient learns to use a non-linear approach by alternating intensity levels and lengths of time spent exercising to help burn more calories and lose more body fat. Cardiovascular exercise helps improve heart health, metabolism, hormonal balance, blood sugar control, and recovery from fatigue. Resistance training improves strength, endurance, balance, coordination, reaction time, metabolism, and muscle mass. Flexibility exercise improves circulation, posture, and balance. Seek guidance from your physician and exercise physiologist before implementing an exercise routine and learn your capabilities and proper form for all exercise.  Introduction to Yoga  Clinical staff conducted group or individual video education with verbal and written material and guidebook.  Patient learns about yoga, a discipline of the coming together of mind, breath, and body. The benefits of yoga include improved flexibility, improved range of motion, better posture and core strength, increased lung function, weight loss, and positive self-image. Yoga's heart health benefits include lowered blood pressure, healthier heart rate, decreased cholesterol and triglyceride levels, improved immune function, and reduced stress. Seek guidance from your physician and exercise physiologist before implementing an exercise routine and learn your capabilities and proper form for all exercise.  Medical   Aging: Enhancing Your Quality of Life  Clinical staff conducted group or individual video education with verbal and written material and guidebook.  Patient learns key strategies and recommendations to stay in good physical health and enhance quality of life, such as prevention strategies, having an advocate, securing a Health Care Proxy and Power of Attorney, and keeping a list of medications and system for tracking them. It also discusses how to avoid risk for bone  loss.  Biology of Weight Control  Clinical staff conducted group or individual video education with verbal and written material and guidebook.  Patient learns that weight gain occurs because we consume more calories than we burn (eating more, moving less). Even if your body weight is normal, you may have higher ratios of fat compared to muscle mass. Too much body fat puts you at increased risk for cardiovascular disease, heart attack, stroke, type 2 diabetes, and obesity-related cancers. In addition to exercise, following the Pritikin Eating Plan can help reduce your risk.  Decoding Lab Results  Clinical staff conducted group or individual video education with verbal and written material and guidebook.  Patient learns that lab test reflects one measurement whose values change over time and are influenced by many factors, including medication, stress, sleep, exercise, food, hydration, pre-existing medical conditions, and more. It is recommended to use the knowledge from this video to become more involved with your lab results and evaluate your numbers to speak with your doctor.   Diseases of Our Time - Overview  Clinical staff conducted group or individual video education with verbal and written material and guidebook.  Patient learns that according to the CDC, 50% to 70% of chronic diseases (such as obesity, type 2 diabetes, elevated lipids, hypertension, and heart disease) are avoidable through lifestyle improvements including healthier food choices, listening to satiety cues, and increased physical activity.  Sleep  Disorders Clinical staff conducted group or individual video education with verbal and written material and guidebook.  Patient learns how good quality and duration of sleep are important to overall health and well-being. Patient also learns about sleep disorders and how they impact health along with recommendations to address them, including discussing with a physician.  Nutrition   Dining Out - Part 2 Clinical staff conducted group or individual video education with verbal and written material and guidebook.  Patient learns how to plan ahead and communicate in order to maximize their dining experience in a healthy and nutritious manner. Included are recommended food choices based on the type of restaurant the patient is visiting.   Fueling a Banker conducted group or individual video education with verbal and written material and guidebook.  There is a strong connection between our food choices and our health. Diseases like obesity and type 2 diabetes are very prevalent and are in large-part due to lifestyle choices. The Pritikin Eating Plan provides plenty of food and hunger-curbing satisfaction. It is easy to follow, affordable, and helps reduce health risks.  Menu Workshop  Clinical staff conducted group or individual video education with verbal and written material and guidebook.  Patient learns that restaurant meals can sabotage health goals because they are often packed with calories, fat, sodium, and sugar. Recommendations include strategies to plan ahead and to communicate with the manager, chef, or server to help order a healthier meal.  Planning Your Eating Strategy  Clinical staff conducted group or individual video education with verbal and written material and guidebook.  Patient learns about the Pritikin Eating Plan and its benefit of reducing the risk of disease. The Pritikin Eating Plan does not focus on calories. Instead, it emphasizes high-quality, nutrient-rich foods. By knowing the characteristics of the foods, we choose, we can determine their calorie density and make informed decisions.  Targeting Your Nutrition Priorities  Clinical staff conducted group or individual video education with verbal and written material and guidebook.  Patient learns that lifestyle habits have a tremendous impact on disease risk and progression. This  video provides eating and physical activity recommendations based on your personal health goals, such as reducing LDL cholesterol, losing weight, preventing or controlling type 2 diabetes, and reducing high blood pressure.  Vitamins and Minerals  Clinical staff conducted group or individual video education with verbal and written material and guidebook.  Patient learns different ways to obtain key vitamins and minerals, including through a recommended healthy diet. It is important to discuss all supplements you take with your doctor.   Healthy Mind-Set    Smoking Cessation  Clinical staff conducted group or individual video education with verbal and written material and guidebook.  Patient learns that cigarette smoking and tobacco addiction pose a serious health risk which affects millions of people. Stopping smoking will significantly reduce the risk of heart disease, lung disease, and many forms of cancer. Recommended strategies for quitting are covered, including working with your doctor to develop a successful plan.  Culinary   Becoming a Set designer conducted group or individual video education with verbal and written material and guidebook.  Patient learns that cooking at home can be healthy, cost-effective, quick, and puts them in control. Keys to cooking healthy recipes will include looking at your recipe, assessing your equipment needs, planning ahead, making it simple, choosing cost-effective seasonal ingredients, and limiting the use of added fats, salts, and sugars.  Cooking - Breakfast and Snacks  Clinical staff conducted group or individual video education with verbal and written material and guidebook.  Patient learns how important breakfast is to satiety and nutrition through the entire day. Recommendations include key foods to eat during breakfast to help stabilize blood sugar levels and to prevent overeating at meals later in the day. Planning ahead is also a  key component.  Cooking - Educational psychologist conducted group or individual video education with verbal and written material and guidebook.  Patient learns eating strategies to improve overall health, including an approach to cook more at home. Recommendations include thinking of animal protein as a side on your plate rather than center stage and focusing instead on lower calorie dense options like vegetables, fruits, whole grains, and plant-based proteins, such as beans. Making sauces in large quantities to freeze for later and leaving the skin on your vegetables are also recommended to maximize your experience.  Cooking - Healthy Salads and Dressing Clinical staff conducted group or individual video education with verbal and written material and guidebook.  Patient learns that vegetables, fruits, whole grains, and legumes are the foundations of the Pritikin Eating Plan. Recommendations include how to incorporate each of these in flavorful and healthy salads, and how to create homemade salad dressings. Proper handling of ingredients is also covered. Cooking - Soups and [employer] - Soups and Desserts Clinical staff conducted group or individual video education with verbal and written material and guidebook.  Patient learns that Pritikin soups and desserts make for easy, nutritious, and delicious snacks and meal components that are low in sodium, fat, sugar, and calorie density, while high in vitamins, minerals, and filling fiber. Recommendations include simple and healthy ideas for soups and desserts.   Overview     The Pritikin Solution Program Overview Clinical staff conducted group or individual video education with verbal and written material and guidebook.  Patient learns that the results of the Pritikin Program have been documented in more than 100 articles published in peer-reviewed journals, and the benefits include reducing risk factors for (and, in some cases, even  reversing) high cholesterol, high blood pressure, type 2 diabetes, obesity, and more! An overview of the three key pillars of the Pritikin Program will be covered: eating well, doing regular exercise, and having a healthy mind-set.  WORKSHOPS  Exercise: Exercise Basics: Building Your Action Plan Clinical staff led group instruction and group discussion with PowerPoint presentation and patient guidebook. To enhance the learning environment the use of posters, models and videos may be added. At the conclusion of this workshop, patients will comprehend the difference between physical activity and exercise, as well as the benefits of incorporating both, into their routine. Patients will understand the FITT (Frequency, Intensity, Time, and Type) principle and how to use it to build an exercise action plan. In addition, safety concerns and other considerations for exercise and cardiac rehab will be addressed by the presenter. The purpose of this lesson is to promote a comprehensive and effective weekly exercise routine in order to improve patients' overall level of fitness.   Managing Heart Disease: Your Path to a Healthier Heart Clinical staff led group instruction and group discussion with PowerPoint presentation and patient guidebook. To enhance the learning environment the use of posters, models and videos may be added.At the conclusion of this workshop, patients will understand the anatomy and physiology of the heart. Additionally, they will understand how Pritikin's three pillars impact the risk factors, the progression, and the management of  heart disease.  The purpose of this lesson is to provide a high-level overview of the heart, heart disease, and how the Pritikin lifestyle positively impacts risk factors.  Exercise Biomechanics Clinical staff led group instruction and group discussion with PowerPoint presentation and patient guidebook. To enhance the learning environment the use of  posters, models and videos may be added. Patients will learn how the structural parts of their bodies function and how these functions impact their daily activities, movement, and exercise. Patients will learn how to promote a neutral spine, learn how to manage pain, and identify ways to improve their physical movement in order to promote healthy living. The purpose of this lesson is to expose patients to common physical limitations that impact physical activity. Participants will learn practical ways to adapt and manage aches and pains, and to minimize their effect on regular exercise. Patients will learn how to maintain good posture while sitting, walking, and lifting.  Balance Training and Fall Prevention  Clinical staff led group instruction and group discussion with PowerPoint presentation and patient guidebook. To enhance the learning environment the use of posters, models and videos may be added. At the conclusion of this workshop, patients will understand the importance of their sensorimotor skills (vision, proprioception, and the vestibular system) in maintaining their ability to balance as they age. Patients will apply a variety of balancing exercises that are appropriate for their current level of function. Patients will understand the common causes for poor balance, possible solutions to these problems, and ways to modify their physical environment in order to minimize their fall risk. The purpose of this lesson is to teach patients about the importance of maintaining balance as they age and ways to minimize their risk of falling.  WORKSHOPS   Nutrition:  Fueling a Ship broker led group instruction and group discussion with PowerPoint presentation and patient guidebook. To enhance the learning environment the use of posters, models and videos may be added. Patients will review the foundational principles of the Pritikin Eating Plan and understand what constitutes a  serving size in each of the food groups. Patients will also learn Pritikin-friendly foods that are better choices when away from home and review make-ahead meal and snack options. Calorie density will be reviewed and applied to three nutrition priorities: weight maintenance, weight loss, and weight gain. The purpose of this lesson is to reinforce (in a group setting) the key concepts around what patients are recommended to eat and how to apply these guidelines when away from home by planning and selecting Pritikin-friendly options. Patients will understand how calorie density may be adjusted for different weight management goals.  Mindful Eating  Clinical staff led group instruction and group discussion with PowerPoint presentation and patient guidebook. To enhance the learning environment the use of posters, models and videos may be added. Patients will briefly review the concepts of the Pritikin Eating Plan and the importance of low-calorie dense foods. The concept of mindful eating will be introduced as well as the importance of paying attention to internal hunger signals. Triggers for non-hunger eating and techniques for dealing with triggers will be explored. The purpose of this lesson is to provide patients with the opportunity to review the basic principles of the Pritikin Eating Plan, discuss the value of eating mindfully and how to measure internal cues of hunger and fullness using the Hunger Scale. Patients will also discuss reasons for non-hunger eating and learn strategies to use for controlling emotional eating.  Targeting Your Nutrition  Priorities Clinical staff led group instruction and group discussion with PowerPoint presentation and patient guidebook. To enhance the learning environment the use of posters, models and videos may be added. Patients will learn how to determine their genetic susceptibility to disease by reviewing their family history. Patients will gain insight into the  importance of diet as part of an overall healthy lifestyle in mitigating the impact of genetics and other environmental insults. The purpose of this lesson is to provide patients with the opportunity to assess their personal nutrition priorities by looking at their family history, their own health history and current risk factors. Patients will also be able to discuss ways of prioritizing and modifying the Pritikin Eating Plan for their highest risk areas  Menu  Clinical staff led group instruction and group discussion with PowerPoint presentation and patient guidebook. To enhance the learning environment the use of posters, models and videos may be added. Using menus brought in from [employer], or printed from [employer], patients will apply the Pritikin dining out guidelines that were presented in the [employer] video. Patients will also be able to practice these guidelines in a variety of provided scenarios. The purpose of this lesson is to provide patients with the opportunity to practice hands-on learning of the Pritikin Dining Out guidelines with actual menus and practice scenarios.  Label Reading Clinical staff led group instruction and group discussion with PowerPoint presentation and patient guidebook. To enhance the learning environment the use of posters, models and videos may be added. Patients will review and discuss the Pritikin label reading guidelines presented in Pritikin's Label Reading Educational series video. Using fool labels brought in from local grocery stores and markets, patients will apply the label reading guidelines and determine if the packaged food meet the Pritikin guidelines. The purpose of this lesson is to provide patients with the opportunity to review, discuss, and practice hands-on learning of the Pritikin Label Reading guidelines with actual packaged food labels. Cooking School  Pritikin's [employer] are designed to teach  patients ways to prepare quick, simple, and affordable recipes at home. The importance of nutrition's role in chronic disease risk reduction is reflected in its emphasis in the overall Pritikin program. By learning how to prepare essential core Pritikin Eating Plan recipes, patients will increase control over what they eat; be able to customize the flavor of foods without the use of added salt, sugar, or fat; and improve the quality of the food they consume. By learning a set of core recipes which are easily assembled, quickly prepared, and affordable, patients are more likely to prepare more healthy foods at home. These workshops focus on convenient breakfasts, simple entres, side dishes, and desserts which can be prepared with minimal effort and are consistent with nutrition recommendations for cardiovascular risk reduction. Cooking [employer] are taught by a Armed forces logistics/support/administrative officer (RD) who has been trained by the [employer]. The chef or RD has a clear understanding of the importance of minimizing - if not completely eliminating - added fat, sugar, and sodium in recipes. Throughout the series of Cooking School Workshop sessions, patients will learn about healthy ingredients and efficient methods of cooking to build confidence in their capability to prepare    Cooking School weekly topics:  Adding Flavor- Sodium-Free  Fast and Healthy Breakfasts  Powerhouse Plant-Based Proteins  Satisfying Salads and Dressings  Simple Sides and Sauces  International Cuisine-Spotlight on the [employer] Desserts  [employer]  Efficiency Cooking - Meals in a Snap  Tasty Appetizers and Snacks  Comforting Weekend Breakfasts  One-Pot Wonders   Fast Evening Meals  Landscape architect Your Pritikin Plate  WORKSHOPS   Healthy Mindset (Psychosocial):  Focused Goals, Sustainable Changes Clinical staff led group instruction and group discussion with PowerPoint  presentation and patient guidebook. To enhance the learning environment the use of posters, models and videos may be added. Patients will be able to apply effective goal setting strategies to establish at least one personal goal, and then take consistent, meaningful action toward that goal. They will learn to identify common barriers to achieving personal goals and develop strategies to overcome them. Patients will also gain an understanding of how our mind-set can impact our ability to achieve goals and the importance of cultivating a positive and growth-oriented mind-set. The purpose of this lesson is to provide patients with a deeper understanding of how to set and achieve personal goals, as well as the tools and strategies needed to overcome common obstacles which may arise along the way.  From Head to Heart: The Power of a Healthy Outlook  Clinical staff led group instruction and group discussion with PowerPoint presentation and patient guidebook. To enhance the learning environment the use of posters, models and videos may be added. Patients will be able to recognize and describe the impact of emotions and mood on physical health. They will discover the importance of self-care and explore self-care practices which may work for them. Patients will also learn how to utilize the 4 C's to cultivate a healthier outlook and better manage stress and challenges. The purpose of this lesson is to demonstrate to patients how a healthy outlook is an essential part of maintaining good health, especially as they continue their cardiac rehab journey.  Healthy Sleep for a Healthy Heart Clinical staff led group instruction and group discussion with PowerPoint presentation and patient guidebook. To enhance the learning environment the use of posters, models and videos may be added. At the conclusion of this workshop, patients will be able to demonstrate knowledge of the importance of sleep to overall health, well-being,  and quality of life. They will understand the symptoms of, and treatments for, common sleep disorders. Patients will also be able to identify daytime and nighttime behaviors which impact sleep, and they will be able to apply these tools to help manage sleep-related challenges. The purpose of this lesson is to provide patients with a general overview of sleep and outline the importance of quality sleep. Patients will learn about a few of the most common sleep disorders. Patients will also be introduced to the concept of "sleep hygiene," and discover ways to self-manage certain sleeping problems through simple daily behavior changes. Finally, the workshop will motivate patients by clarifying the links between quality sleep and their goals of heart-healthy living.   Recognizing and Reducing Stress Clinical staff led group instruction and group discussion with PowerPoint presentation and patient guidebook. To enhance the learning environment the use of posters, models and videos may be added. At the conclusion of this workshop, patients will be able to understand the types of stress reactions, differentiate between acute and chronic stress, and recognize the impact that chronic stress has on their health. They will also be able to apply different coping mechanisms, such as reframing negative self-talk. Patients will have the opportunity to practice a variety of stress management techniques, such as deep abdominal breathing, progressive muscle relaxation, and/or guided imagery.  The purpose of  this lesson is to educate patients on the role of stress in their lives and to provide healthy techniques for coping with it.  Learning Barriers/Preferences:  Learning Barriers/Preferences - 06/18/24 1520       Learning Barriers/Preferences   Learning Barriers None    Learning Preferences Audio;Computer/Internet;Individual Instruction;Group Instruction;Pictoral;Skilled Demonstration;Verbal Instruction;Video;Written  Material          Education Topics:  Knowledge Questionnaire Score:  Knowledge Questionnaire Score - 06/18/24 1520       Knowledge Questionnaire Score   Pre Score 23/24          Core Components/Risk Factors/Patient Goals at Admission:  Personal Goals and Risk Factors at Admission - 06/18/24 1522       Core Components/Risk Factors/Patient Goals on Admission    Weight Management Yes;Weight Maintenance    Intervention Weight Management: Provide education and appropriate resources to help participant work on and attain dietary goals.;Weight Management: Develop a combined nutrition and exercise program designed to reach desired caloric intake, while maintaining appropriate intake of nutrient and fiber, sodium and fats, and appropriate energy expenditure required for the weight goal.    Expected Outcomes Short Term: Continue to assess and modify interventions until short term weight is achieved;Long Term: Adherence to nutrition and physical activity/exercise program aimed toward attainment of established weight goal;Weight Maintenance: Understanding of the daily nutrition guidelines, which includes 25-35% calories from fat, 7% or less cal from saturated fats, less than 200mg  cholesterol, less than 1.5gm of sodium, & 5 or more servings of fruits and vegetables daily;Understanding of distribution of calorie intake throughout the day with the consumption of 4-5 meals/snacks;Understanding recommendations for meals to include 15-35% energy as protein, 25-35% energy from fat, 35-60% energy from carbohydrates, less than 200mg  of dietary cholesterol, 20-35 gm of total fiber daily    Hypertension Yes    Intervention Provide education on lifestyle modifcations including regular physical activity/exercise, weight management, moderate sodium restriction and increased consumption of fresh fruit, vegetables, and low fat dairy, alcohol moderation, and smoking cessation.;Monitor prescription use compliance.     Expected Outcomes Short Term: Continued assessment and intervention until BP is < 140/20mm HG in hypertensive participants. < 130/61mm HG in hypertensive participants with diabetes, heart failure or chronic kidney disease.;Long Term: Maintenance of blood pressure at goal levels.    Lipids Yes    Intervention Provide education and support for participant on nutrition & aerobic/resistive exercise along with prescribed medications to achieve LDL 70mg , HDL >40mg .    Expected Outcomes Short Term: Participant states understanding of desired cholesterol values and is compliant with medications prescribed. Participant is following exercise prescription and nutrition guidelines.;Long Term: Cholesterol controlled with medications as prescribed, with individualized exercise RX and with personalized nutrition plan. Value goals: LDL < 70mg , HDL > 40 mg.          Core Components/Risk Factors/Patient Goals Review:   Goals and Risk Factor Review     Row Name 06/24/24 0832 07/04/24 0830           Core Components/Risk Factors/Patient Goals Review   Personal Goals Review Weight Management/Obesity;Hypertension;Lipids Weight Management/Obesity;Hypertension;Lipids      Review Revanth started cardiac rehab on 06/23/24. Burak did well with exercise. Systolic BP's were noted to be in the upper 90's to low 100's. patient asymptomatic. Will continue to monitor  BP Jyquan started cardiac rehab on 06/23/24. Juana is off to a good start with exercise. Vital signs have been stable. Michelangelo has increased his met levels  Expected Outcomes Ivann will continue to participate in cardiac rehab for exercise, nutrition and lifestyle modifications Bronislaw will continue to participate in cardiac rehab for exercise, nutrition and lifestyle modifications         Core Components/Risk Factors/Patient Goals at Discharge (Final Review):   Goals and Risk Factor Review - 07/04/24 0830       Core Components/Risk Factors/Patient Goals  Review   Personal Goals Review Weight Management/Obesity;Hypertension;Lipids    Review Kinsley started cardiac rehab on 06/23/24. Greyson is off to a good start with exercise. Vital signs have been stable. Irving has increased his met levels    Expected Outcomes Averey will continue to participate in cardiac rehab for exercise, nutrition and lifestyle modifications          ITP Comments:  ITP Comments     Row Name 06/18/24 1318 06/24/24 0819 07/04/24 0829       ITP Comments Dr. Wilbert Bihari medical director. Introduction to pritikin education/intensive cardiac rehab. Initial orientation packet reviewed with patient. 30 Day ITP Review. Abas started cardiac rehab on 06/24/24. Cecile did well with exercise. 30 Day ITP Review. Wrangler started cardiac rehab on 06/24/24. Edvin is off to a good start  with exercise.        Comments: See ITP comments.[name] RN BSN

## 2024-07-07 ENCOUNTER — Encounter (HOSPITAL_COMMUNITY)

## 2024-07-09 ENCOUNTER — Encounter (HOSPITAL_COMMUNITY)

## 2024-07-11 ENCOUNTER — Encounter (HOSPITAL_COMMUNITY)

## 2024-07-14 ENCOUNTER — Encounter (HOSPITAL_COMMUNITY)
Admission: RE | Admit: 2024-07-14 | Discharge: 2024-07-14 | Disposition: A | Discharge status: Home / Self Care | Source: Ambulatory Visit | Attending: Cardiology | Admitting: Cardiology

## 2024-07-14 DIAGNOSIS — Z951 Presence of aortocoronary bypass graft: Secondary | ICD-10-CM

## 2024-07-14 DIAGNOSIS — Z48812 Encounter for surgical aftercare following surgery on the circulatory system: Secondary | ICD-10-CM | POA: Diagnosis not present

## 2024-07-16 ENCOUNTER — Encounter (HOSPITAL_COMMUNITY)
Admission: RE | Admit: 2024-07-16 | Discharge: 2024-07-16 | Disposition: A | Discharge status: Home / Self Care | Source: Ambulatory Visit | Attending: Cardiology | Admitting: Cardiology

## 2024-07-16 DIAGNOSIS — Z48812 Encounter for surgical aftercare following surgery on the circulatory system: Secondary | ICD-10-CM | POA: Diagnosis not present

## 2024-07-16 DIAGNOSIS — Z951 Presence of aortocoronary bypass graft: Secondary | ICD-10-CM | POA: Diagnosis not present

## 2024-07-18 ENCOUNTER — Encounter (HOSPITAL_COMMUNITY)
Admission: RE | Admit: 2024-07-18 | Discharge: 2024-07-18 | Disposition: A | Discharge status: Home / Self Care | Source: Ambulatory Visit | Attending: Cardiology | Admitting: Cardiology

## 2024-07-18 DIAGNOSIS — Z951 Presence of aortocoronary bypass graft: Secondary | ICD-10-CM

## 2024-07-18 DIAGNOSIS — Z48812 Encounter for surgical aftercare following surgery on the circulatory system: Secondary | ICD-10-CM | POA: Diagnosis not present

## 2024-07-21 ENCOUNTER — Encounter (HOSPITAL_COMMUNITY)
Admission: RE | Admit: 2024-07-21 | Discharge: 2024-07-21 | Disposition: A | Discharge status: Home / Self Care | Source: Ambulatory Visit | Attending: Cardiology | Admitting: Cardiology

## 2024-07-21 ENCOUNTER — Telehealth (HOSPITAL_COMMUNITY): Payer: Self-pay

## 2024-07-21 DIAGNOSIS — Z951 Presence of aortocoronary bypass graft: Secondary | ICD-10-CM

## 2024-07-21 DIAGNOSIS — Z48812 Encounter for surgical aftercare following surgery on the circulatory system: Secondary | ICD-10-CM | POA: Diagnosis not present

## 2024-07-21 NOTE — Telephone Encounter (Signed)
-----   Message from [name] sent at 07/19/2024  6:45 AM EDT ----- Regarding: RE: THRR and Jog request I am agreeable to exercise change prescription [name], MD ----- Message ----- From: Vannie Alec RAMAN Sent: 07/17/2024   9:02 AM EDT To: Oneil JAYSON Parchment, MD Subject: KENDA and Jog request                           CARDIAC REHAB PHASE 2  Our mutual patient Gary Castaneda has been in the cardiac rehabilitation program approximately 4 weeks and is doing well. Patient would like to begin jogging. His current MET level is 4.6. With the increase my assumption is that he will exceed his current Target Heart Rate Range(THRR) of 68-136 bpm (40%-80% of age predicted max HR).  If no GXT is planned for the near future and MD agrees, request to increase THR to 40% -90% of age predicted max HR 68-152 bpm. Please indicate if you are agreeable to these change in exercise prescription.   Thanks for your assistance,  Alec RAMAN Vannie ACSM-CEP 07/17/2024 8:57 AM

## 2024-07-23 ENCOUNTER — Encounter (HOSPITAL_COMMUNITY)
Admission: RE | Admit: 2024-07-23 | Discharge: 2024-07-23 | Disposition: A | Discharge status: Home / Self Care | Source: Ambulatory Visit | Attending: Cardiology | Admitting: Cardiology

## 2024-07-23 DIAGNOSIS — Z951 Presence of aortocoronary bypass graft: Secondary | ICD-10-CM | POA: Diagnosis not present

## 2024-07-23 DIAGNOSIS — Z48812 Encounter for surgical aftercare following surgery on the circulatory system: Secondary | ICD-10-CM | POA: Diagnosis not present

## 2024-07-25 ENCOUNTER — Encounter (HOSPITAL_COMMUNITY)
Admission: RE | Admit: 2024-07-25 | Discharge: 2024-07-25 | Disposition: A | Discharge status: Home / Self Care | Source: Ambulatory Visit | Attending: Cardiology | Admitting: Cardiology

## 2024-07-25 DIAGNOSIS — Z951 Presence of aortocoronary bypass graft: Secondary | ICD-10-CM | POA: Diagnosis not present

## 2024-07-25 DIAGNOSIS — Z48812 Encounter for surgical aftercare following surgery on the circulatory system: Secondary | ICD-10-CM | POA: Diagnosis not present

## 2024-07-30 ENCOUNTER — Encounter (HOSPITAL_COMMUNITY)
Admission: RE | Admit: 2024-07-30 | Discharge: 2024-07-30 | Disposition: A | Discharge status: Home / Self Care | Source: Ambulatory Visit | Attending: Cardiology | Admitting: Cardiology

## 2024-07-30 DIAGNOSIS — Z48812 Encounter for surgical aftercare following surgery on the circulatory system: Secondary | ICD-10-CM | POA: Insufficient documentation

## 2024-07-30 DIAGNOSIS — Z951 Presence of aortocoronary bypass graft: Secondary | ICD-10-CM | POA: Insufficient documentation

## 2024-08-01 ENCOUNTER — Encounter (HOSPITAL_COMMUNITY)
Admission: RE | Admit: 2024-08-01 | Discharge: 2024-08-01 | Disposition: A | Discharge status: Home / Self Care | Source: Ambulatory Visit | Attending: Cardiology | Admitting: Cardiology

## 2024-08-01 DIAGNOSIS — Z951 Presence of aortocoronary bypass graft: Secondary | ICD-10-CM

## 2024-08-01 DIAGNOSIS — Z48812 Encounter for surgical aftercare following surgery on the circulatory system: Secondary | ICD-10-CM | POA: Diagnosis not present

## 2024-08-04 ENCOUNTER — Encounter (HOSPITAL_COMMUNITY)
Admission: RE | Admit: 2024-08-04 | Discharge: 2024-08-04 | Disposition: A | Discharge status: Home / Self Care | Source: Ambulatory Visit | Attending: Cardiology

## 2024-08-04 DIAGNOSIS — Z48812 Encounter for surgical aftercare following surgery on the circulatory system: Secondary | ICD-10-CM | POA: Diagnosis not present

## 2024-08-04 DIAGNOSIS — Z951 Presence of aortocoronary bypass graft: Secondary | ICD-10-CM

## 2024-08-05 NOTE — Progress Notes (Signed)
 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  Patient's Home Medications on Admission:  Current Outpatient Medications:    amLODipine  (NORVASC ) 5 MG tablet, TAKE 1 TABLET (5MG ) BY MOUTH DAILY., Disp: 30 tablet, Rfl: 3   Ascorbic Acid (VITAMIN C PO), Take 1 tablet by mouth daily., Disp: , Rfl:    aspirin  EC (QC ENTERIC ASPIRIN ) 325 MG tablet, TAKE 1 TABLET (325MG ) BY MOUTH DAILY., Disp: 30 tablet, Rfl: 3   Cholecalciferol (D3 PO), Take 1 tablet by mouth daily., Disp: , Rfl:    Evolocumab  with Infusor (REPATHA  PUSHTRONEX SYSTEM) 420 MG/3.5ML SOCT, Inject 420 mg into the skin every 30 (thirty) days., Disp: 3.5 mL, Rfl: 3   ezetimibe  (ZETIA ) 10 MG tablet, Take 10 mg by mouth daily., Disp: , Rfl:    Ferrous Sulfate (IRON PO), Take 1 tablet by mouth daily., Disp: , Rfl:    Menaquinone-7 (K2 PO), Take 1 tablet by mouth daily., Disp: , Rfl:    metoprolol  tartrate (LOPRESSOR ) 25 MG tablet, Take 0.5 tablets (12.5 mg total) by mouth 2 (two) times daily., Disp: 60 tablet, Rfl: 1   potassium chloride  SA (KLOR-CON  M) 20 MEQ tablet, Take 1 tablet (20 mEq total) by mouth 2 (two) times daily for 1 day. (Patient not taking: Reported on 06/18/2024), Disp: 2 tablet, Rfl: 0   REPATHA  SURECLICK 140 MG/ML SOAJ, Inject 140 mg into the skin every 14 (fourteen) days., Disp: 2 mL, Rfl: 0   rosuvastatin  (CRESTOR ) 20 MG tablet, TAKE (1) TABLET DAILY AT BEDTIME., Disp: 90 tablet, Rfl: 0  Past Medical History: Past Medical History:   Diagnosis Date   Coronary artery disease    HTN (hypertension)    Hyperlipidemia LDL goal < 100     Tobacco Use: Social History   Tobacco Use  Smoking Status Never  Smokeless Tobacco Never    Labs: Review Flowsheet       Latest Ref Rng & Units 04/22/2024 04/24/2024  Labs for ITP Cardiac and Pulmonary Rehab  Hemoglobin A1c 4.8 - 5.6 % 4.9  -  PH, Arterial 7.35 - 7.45 - 7.327  7.408  7.462  7.453  7.440   PCO2 arterial 32 - 48 mmHg - 46.1  28.3  33.9  32.9  36.7   Bicarbonate 20.0 - 28.0 mmol/L - 24.5  18.0  24.2  25.3  23.0  24.9   TCO2 22 - 32 mmol/L - 26  19  25  26  27  26  24  28  25  26    Acid-base deficit 0.0 - 2.0 mmol/L - 2.0  6.0  1.0   O2 Saturation % - 98  99  100  89  100  100     Details       Multiple values from one day are sorted in reverse-chronological order          Exercise Target Goals: Exercise Program Goal: Individual exercise prescription set using results from initial 6 min walk test and THRR while considering  patient's activity barriers  and safety.   Exercise Prescription Goal: Initial exercise prescription builds to 30-45 minutes a day of aerobic activity, 2-3 days per week.  Home exercise guidelines will be given to patient during program as part of exercise prescription that the participant will acknowledge.   Education: Aerobic Exercise: - Group verbal and visual presentation on the components of exercise prescription. Introduces F.I.T.T principle from ACSM for exercise prescriptions.  Reviews F.I.T.T. principles of aerobic exercise including progression. Written material provided at class time.   Education: Resistance Exercise: - Group verbal and visual presentation on the components of exercise prescription. Introduces F.I.T.T principle from ACSM for exercise prescriptions  Reviews F.I.T.T. principles of resistance exercise including progression. Written material provided at class time.    Education: Exercise & Equipment Safety: -  Individual verbal instruction and demonstration of equipment use and safety with use of the equipment.   Education: Exercise Physiology & General Exercise Guidelines: - Group verbal and written instruction with models to review the exercise physiology of the cardiovascular system and associated critical values. Provides general exercise guidelines with specific guidelines to those with heart or lung disease. Written material provided at class time.   Education: Flexibility, Balance, Mind/Body Relaxation: - Group verbal and visual presentation with interactive activity on the components of exercise prescription. Introduces F.I.T.T principle from ACSM for exercise prescriptions. Reviews F.I.T.T. principles of flexibility and balance exercise training including progression. Also discusses the mind body connection.  Reviews various relaxation techniques to help reduce and manage stress (i.e. Deep breathing, progressive muscle relaxation, and visualization). Balance handout provided to take home. Written material provided at class time.   Activity Barriers & Risk Stratification:  Activity Barriers & Cardiac Risk Stratification - 06/18/24 1517       Activity Barriers & Cardiac Risk Stratification   Activity Barriers Incisional Pain;Other (comment)    Comments sternal precautions    Cardiac Risk Stratification High   <5 METs on         6 Minute Walk:  6 Minute Walk     Row Name 06/18/24 1517         6 Minute Walk   Phase Initial     Distance 1340 feet     Walk Time 6 minutes     # of Rest Breaks 0     MPH 2.54     METS 1.06     RPE 9.5     Perceived Dyspnea  0     VO2 Peak 14.21     Symptoms No     Resting HR 68 bpm     Resting BP 104/68     Resting Oxygen Saturation  98 %     Exercise Oxygen Saturation  during 6 min walk 98 %     Max Ex. HR 79 bpm     Max Ex. BP 130/62     2 Minute Post BP 100/72        Oxygen Initial Assessment:   Oxygen  Re-Evaluation:   Oxygen Discharge (Final Oxygen Re-Evaluation):   Initial Exercise Prescription:  Initial Exercise Prescription - 06/18/24 1500       Date of Initial Exercise RX and Referring Provider   Date 06/18/24    Referring Provider [name], MD    Expected Discharge Date 09/10/24      Treadmill   MPH 3    Grade 0    Minutes 15    METs 2.5      Recumbant Elliptical   Level  1    RPM 60    Watts 70    Minutes 15    METs 2.5      Prescription Details   Frequency (times per week) 3    Duration Progress to 30 minutes of continuous aerobic without signs/symptoms of physical distress      Intensity   THRR 40-80% of Max Heartrate 68-136    Ratings of Perceived Exertion 11-13    Perceived Dyspnea 0-4      Progression   Progression Continue progressive overload as per policy without signs/symptoms or physical distress.      Resistance Training   Training Prescription Yes    Weight 3    Reps 10-15          Perform Capillary Blood Glucose checks as needed.  Exercise Prescription Changes:   Exercise Prescription Changes     Row Name 06/23/24 1646 07/14/24 1706 07/25/24 1703 07/30/24 1630       Response to Exercise   Blood Pressure (Admit) 106/62 102/78 102/68 108/60    Blood Pressure (Exercise) 106/70 124/72 -- --    Blood Pressure (Exit) 104/60 130/82 118/70 94/60    Heart Rate (Admit) 87 bpm 74 bpm 71 bpm 63 bpm    Heart Rate (Exercise) 118 bpm 120 bpm 116 bpm 126 bpm    Heart Rate (Exit) 94 bpm 94 bpm 76 bpm 66 bpm    Rating of Perceived Exertion (Exercise) 9.5 7 6  7.5    Perceived Dyspnea (Exercise) 0 0 0 0    Symptoms None None None None    Comments Pt first day int eh [facility] I[facility] program. Reviewed MET's, goals and home ExRx REVD MET's REVD MET's, goals and gradual weight lifting progression    Duration Progress to 30 minutes of  aerobic without signs/symptoms of physical distress Progress to 30 minutes of  aerobic without signs/symptoms of  physical distress Progress to 30 minutes of  aerobic without signs/symptoms of physical distress Progress to 30 minutes of  aerobic without signs/symptoms of physical distress    Intensity THRR unchanged THRR unchanged THRR unchanged THRR unchanged      Progression   Progression Continue to progress workloads to maintain intensity without signs/symptoms of physical distress. Continue to progress workloads to maintain intensity without signs/symptoms of physical distress. Continue to progress workloads to maintain intensity without signs/symptoms of physical distress. Continue to progress workloads to maintain intensity without signs/symptoms of physical distress.    Average METs 3.15 4.43 5.64 5.8      Resistance Training   Training Prescription Yes Yes Yes No    Weight 3 3 3 3     Reps 10-15 10-15 10-15 10-15    Time 10 Minutes 10 Minutes 10 Minutes 10 Minutes      Interval Training   Interval Training -- -- Yes Yes    Equipment -- -- Treadmill Treadmill    Comments -- -- 2 min walk, 1 min jog 2 min walk, 1 min jog      Treadmill   MPH 3 3.5 3.5  Jog 4.2 speed, 7.43 MET's 3.5  Jog 4.2 speed, 7.43 MET's    Grade 0 1 0 0    Minutes 15 15 15 15     METs 3.3 4.16 3.68 3.68      Recumbant Elliptical   Level 1 3 5 5     RPM 40 51 60 63    Watts 53 83 101 110    Minutes 15 15 15  15  METs 3 4.7 5.8 6.3      Home Exercise Plan   Plans to continue exercise at -- [employer] (comment) [employer] (comment) [employer] (comment)    Frequency -- Add 4 additional days to program exercise sessions. Add 4 additional days to program exercise sessions. Add 4 additional days to program exercise sessions.    Initial Home Exercises Provided -- 07/14/24 07/14/24 07/14/24       Exercise Comments:   Exercise Comments     Row Name 06/23/24 1650 07/14/24 1712 07/25/24 1706 07/30/24 1630     Exercise Comments Pt first day in the [facility] program. Pt tolerated exercise well  with an average MEt level of 3.15. Pt is off to a good start and is learning his THRR, RPE and ExRx` Reviewed MET's, goals and home ExRx. Pt tolerated exercise well with an average MEt level of 4.43. He has been enjoying exercise, but feels he would like more of a challenge. BP's and HR's have been within limits. Will request jogging. He's sternal precautions lift 8/29 and we will review a gradual weight lifting plan then. So far he feels good with his goals and has an increase in strength and stamina. He plans to exercise on his own at [facility], walking and light hand weight for 30-45 mins 3-4 days Reviewed MET's. Pt tolerated exercise well with an average MET level of 4.43. Patient is doing well and gained jogging clearance. Jogging is going well and he is increasing WL's, MET's and endurance. Reviewed MET's and goals. Pt tolerated exercise well with an average MET level of 5.8. Pt is doing well and increasing WL's and MET's. He's doing well with his HIIT training. Reviewed gradual weight lifting progressions since his sternal precautions lifted       Exercise Goals and Review:   Exercise Goals     Row Name 06/18/24 1519             Exercise Goals   Increase Physical Activity Yes       Intervention Provide advice, education, support and counseling about physical activity/exercise needs.;Develop an individualized exercise prescription for aerobic and resistive training based on initial evaluation findings, risk stratification, comorbidities and participant's personal goals.       Expected Outcomes Long Term: Add in home exercise to make exercise part of routine and to increase amount of physical activity.;Long Term: Exercising regularly at least 3-5 days a week.;Short Term: Attend rehab on a regular basis to increase amount of physical activity.       Increase Strength and Stamina Yes       Intervention Develop an individualized exercise prescription for aerobic and resistive training based on  initial evaluation findings, risk stratification, comorbidities and participant's personal goals.;Provide advice, education, support and counseling about physical activity/exercise needs.       Expected Outcomes Short Term: Increase workloads from initial exercise prescription for resistance, speed, and METs.;Long Term: Improve cardiorespiratory fitness, muscular endurance and strength as measured by increased METs and functional capacity ( );Short Term: Perform resistance training exercises routinely during rehab and add in resistance training at home       Able to understand and use rate of perceived exertion (RPE) scale Yes       Intervention Provide education and explanation on how to use RPE scale       Expected Outcomes Short Term: Able to use RPE daily in rehab to express subjective intensity level;Long Term:  Able to use RPE to guide  intensity level when exercising independently       Knowledge and understanding of Target Heart Rate Range (THRR) Yes       Intervention Provide education and explanation of THRR including how the numbers were predicted and where they are located for reference       Expected Outcomes Long Term: Able to use THRR to govern intensity when exercising independently;Short Term: Able to state/look up THRR;Short Term: Able to use daily as guideline for intensity in rehab       Understanding of Exercise Prescription Yes       Intervention Provide education, explanation, and written materials on patient's individual exercise prescription       Expected Outcomes Short Term: Able to explain program exercise prescription;Long Term: Able to explain home exercise prescription to exercise independently          Exercise Goals Re-Evaluation :  Exercise Goals Re-Evaluation     Row Name 06/23/24 1649 07/14/24 1709 07/30/24 1630         Exercise Goal Re-Evaluation   Exercise Goals Review Increase Physical Activity;Understanding of Exercise Prescription;Increase Strength and  Stamina;Knowledge and understanding of Target Heart Rate Range (THRR);Able to understand and use rate of perceived exertion (RPE) scale Increase Physical Activity;Understanding of Exercise Prescription;Increase Strength and Stamina;Knowledge and understanding of Target Heart Rate Range (THRR);Able to understand and use rate of perceived exertion (RPE) scale Increase Physical Activity;Understanding of Exercise Prescription;Increase Strength and Stamina;Knowledge and understanding of Target Heart Rate Range (THRR);Able to understand and use rate of perceived exertion (RPE) scale     Comments Pt first day in the [facility] I[facility] program. Pt tolerated exercise well with an average MEt level of 3.15. Pt is off to a good start and is learning his THRR, RPE and ExRx` Reviewed MET's, goals and home ExRx. Pt tolerated exercise well with an average MEt level of 4.43. He has been enjoying exercise, but feels he would like more of a challenge. BP's and HR's have been within limits. Will request jogging. He's sternal precautions lift 8/29 and we will review a gradual weight lifting plan then. So far he feels good with his goals and has an increase in strength and stamina. He plans to exercise on his own at [facility], walking and light hand weight for 30-45 mins 3-4 days Reviewed MET's and goals. Pt tolerated exercise well with an average MET level of 5.8. Pt is doing well and increasing WL's and MET's. He's doing well with his HIIT training. Reviewed gradual weight lifting progressions since his sternal precautions lifted     Expected Outcomes Will continue to monitor pt anf progress workloads as tolerated without sign or symptom Will continue to monitor pt anf progress workloads as tolerated without sign or symptom Will continue to monitor pt anf progress workloads as tolerated without sign or symptom        Discharge Exercise Prescription (Final Exercise Prescription Changes):  Exercise Prescription Changes - 07/30/24  1630       Response to Exercise   Blood Pressure (Admit) 108/60    Blood Pressure (Exit) 94/60    Heart Rate (Admit) 63 bpm    Heart Rate (Exercise) 126 bpm    Heart Rate (Exit) 66 bpm    Rating of Perceived Exertion (Exercise) 7.5    Perceived Dyspnea (Exercise) 0    Symptoms None    Comments REVD MET's, goals and gradual weight lifting progression    Duration Progress to 30 minutes of  aerobic without signs/symptoms  of physical distress    Intensity THRR unchanged      Progression   Progression Continue to progress workloads to maintain intensity without signs/symptoms of physical distress.    Average METs 5.8      Resistance Training   Training Prescription No    Weight 3    Reps 10-15    Time 10 Minutes      Interval Training   Interval Training Yes    Equipment Treadmill    Comments 2 min walk, 1 min jog      Treadmill   MPH 3.5   Jog 4.2 speed, 7.43 MET's   Grade 0    Minutes 15    METs 3.68      Recumbant Elliptical   Level 5    RPM 63    Watts 110    Minutes 15    METs 6.3      Home Exercise Plan   Plans to continue exercise at [employer] (comment)    Frequency Add 4 additional days to program exercise sessions.    Initial Home Exercises Provided 07/14/24          Nutrition:  Target Goals: Understanding of nutrition guidelines, daily intake of sodium 1500mg , cholesterol 200mg , calories 30% from fat and 7% or less from saturated fats, daily to have 5 or more servings of fruits and vegetables.  Education: Nutrition 1 -Group instruction provided by verbal, written material, interactive activities, discussions, models, and posters to present general guidelines for heart healthy nutrition including macronutrients, label reading, and promoting whole foods over processed counterparts. Education serves as Pensions consultant of discussion of heart healthy eating for all. Written material provided at class time.    Education: Nutrition 2 -Group  instruction provided by verbal, written material, interactive activities, discussions, models, and posters to present general guidelines for heart healthy nutrition including sodium, cholesterol, and saturated fat. Providing guidance of habit forming to improve blood pressure, cholesterol, and body weight. Written material provided at class time.     Biometrics:  Pre Biometrics - 06/18/24 1508       Pre Biometrics   Waist Circumference 35 inches    Hip Circumference 36 inches    Waist to Hip Ratio 0.97 %    Triceps Skinfold 12 mm    % Body Fat 22.3 %    Grip Strength 24 kg    Flexibility 12.25 in    Single Leg Stand 9.52 seconds           Nutrition Therapy Plan and Nutrition Goals:  Nutrition Therapy & Goals - 06/23/24 1603       Nutrition Therapy   Diet Heart healthy diet    Drug/Food Interactions Statins/Certain Fruits      Personal Nutrition Goals   Nutrition Goal Patient to identify strategies for reducing cardiovascular risk by attending the [facility] education and nutrition series weekly.    Personal Goal #2 Patient to improve diet quality by using the plate method as a guide for meal planning to include lean protein/plant protein, fruits, vegetables, whole grains, nonfat dairy as part of a well-balanced diet.    Comments Patient has medical history of CAD s/p CABG x 4 , HTN. Lipid are at goal; he continues repatha , crestor , zetia . Patient will benefit from participation in intensive cardiac rehab for nutrition education, exercise, and lifestyle modification.      Intervention Plan   Intervention Prescribe, educate and counsel regarding individualized specific dietary modifications aiming towards targeted core components such  as weight, hypertension, lipid management, diabetes, heart failure and other comorbidities.;Nutrition handout(s) given to patient.    Expected Outcomes Short Term Goal: Understand basic principles of dietary content, such as calories, fat, sodium,  cholesterol and nutrients.;Long Term Goal: Adherence to prescribed nutrition plan.          Nutrition Assessments:  MEDIFICTS Score Key: >=70 Need to make dietary changes  40-70 Heart Healthy Diet <= 40 Therapeutic Level Cholesterol Diet  Flowsheet Row INTENSIVE CARDIAC REHAB ORIENT from 06/18/2024 in [facility]  Picture Your Plate Total Score on Admission 63   Picture Your Plate Scores: <59 Unhealthy dietary pattern with much room for improvement. 41-50 Dietary pattern unlikely to meet recommendations for good health and room for improvement. 51-60 More healthful dietary pattern, with some room for improvement.  >60 Healthy dietary pattern, although there may be some specific behaviors that could be improved.    Nutrition Goals Re-Evaluation:  Nutrition Goals Re-Evaluation     Row Name 06/23/24 1603             Goals   Current Weight 145 lb 4.5 oz (65.9 kg)       Comment LDL 40, HDL 58, A1c WNL       Expected Outcome Patient has medical history of CAD s/p CABG x 4 , HTN. Lipid are at goal; he continues repatha , crestor , zetia . Patient will benefit from participation in intensive cardiac rehab for nutrition education, exercise, and lifestyle modification.          Nutrition Goals Discharge (Final Nutrition Goals Re-Evaluation):  Nutrition Goals Re-Evaluation - 06/23/24 1603       Goals   Current Weight 145 lb 4.5 oz (65.9 kg)    Comment LDL 40, HDL 58, A1c WNL    Expected Outcome Patient has medical history of CAD s/p CABG x 4 , HTN. Lipid are at goal; he continues repatha , crestor , zetia . Patient will benefit from participation in intensive cardiac rehab for nutrition education, exercise, and lifestyle modification.          Psychosocial: Target Goals: Acknowledge presence or absence of significant depression and/or stress, maximize coping skills, provide positive support system. Participant is able to  verbalize types and ability to use techniques and skills needed for reducing stress and depression.   Education: Stress, Anxiety, and Depression - Group verbal and visual presentation to define topics covered.  Reviews how body is impacted by stress, anxiety, and depression.  Also discusses healthy ways to reduce stress and to treat/manage anxiety and depression. Written material provided at class time.   Education: Sleep Hygiene -Provides group verbal and written instruction about how sleep can affect your health.  Define sleep hygiene, discuss sleep cycles and impact of sleep habits. Review good sleep hygiene tips.   Initial Review & Psychosocial Screening:  Initial Psych Review & Screening - 06/18/24 1520       Initial Review   Current issues with None Identified      Family Dynamics   Good Support System? Yes   wife     Barriers   Psychosocial barriers to participate in program There are no identifiable barriers or psychosocial needs.      Screening Interventions   Interventions Provide feedback about the scores to participant;Encouraged to exercise    Expected Outcomes Long Term goal: The participant improves quality of Life and PHQ9 Scores as seen by post scores and/or verbalization of changes;Short Term goal: Identification  and review with participant of any Quality of Life or Depression concerns found by scoring the questionnaire.          Quality of Life Scores:   Quality of Life - 06/18/24 1520       Quality of Life   Select Quality of Life      Quality of Life Scores   Health/Function Pre 19.8 %    Socioeconomic Pre 25.36 %    Psych/Spiritual Pre 21.93 %    Family Pre 25.2 %    GLOBAL Pre 22.18 %         Scores of 19 and below usually indicate a poorer quality of life in these areas.  A difference of  2-3 points is a clinically meaningful difference.  A difference of 2-3 points in the total score of the Quality of Life Index has been associated with  significant improvement in overall quality of life, self-image, physical symptoms, and general health in studies assessing change in quality of life.  PHQ-9: Review Flowsheet       06/18/2024  Depression screen PHQ 2/9  Decreased Interest 0  Down, Depressed, Hopeless 0  PHQ - 2 Score 0  Altered sleeping 0  Tired, decreased energy 0  Change in appetite 0  Feeling bad or failure about yourself  0  Trouble concentrating 0  Moving slowly or fidgety/restless 0  Suicidal thoughts 0  PHQ-9 Score 0   Interpretation of Total Score  Total Score Depression Severity:  1-4 = Minimal depression, 5-9 = Mild depression, 10-14 = Moderate depression, 15-19 = Moderately severe depression, 20-27 = Severe depression   Psychosocial Evaluation and Intervention:   Psychosocial Re-Evaluation:  Psychosocial Re-Evaluation     Row Name 06/24/24 9177 07/04/24 0829 08/05/24 1649         Psychosocial Re-Evaluation   Current issues with None Identified None Identified None Identified     Comments -- -- Sadie has not voiced any psychosocial concerns or stressors during exercise at cardiac rehab.     Expected Outcomes -- -- Torry will continue to manage all psychosocial concerns and stressors that are made known during cardiac rehab.     Interventions Encouraged to attend Cardiac Rehabilitation for the exercise Encouraged to attend Cardiac Rehabilitation for the exercise Encouraged to attend Cardiac Rehabilitation for the exercise     Continue Psychosocial Services  No Follow up required No Follow up required No Follow up required        Psychosocial Discharge (Final Psychosocial Re-Evaluation):  Psychosocial Re-Evaluation - 08/05/24 1649       Psychosocial Re-Evaluation   Current issues with None Identified    Comments Kaiyon has not voiced any psychosocial concerns or stressors during exercise at cardiac rehab.    Expected Outcomes Zaivion will continue to manage all psychosocial concerns and stressors  that are made known during cardiac rehab.    Interventions Encouraged to attend Cardiac Rehabilitation for the exercise    Continue Psychosocial Services  No Follow up required          Vocational Rehabilitation: Provide vocational rehab assistance to qualifying candidates.   Vocational Rehab Evaluation & Intervention:  Vocational Rehab - 06/18/24 1521       Initial Vocational Rehab Evaluation & Intervention   Assessment shows need for Vocational Rehabilitation No   working         Education: Education Goals: Education classes will be provided on a variety of topics geared toward better understanding of heart health and  risk factor modification. Participant will state understanding/return demonstration of topics presented as noted by education test scores.  Learning Barriers/Preferences:  Learning Barriers/Preferences - 06/18/24 1520       Learning Barriers/Preferences   Learning Barriers None    Learning Preferences Audio;Computer/Internet;Individual Instruction;Group Instruction;Pictoral;Skilled Demonstration;Verbal Instruction;Video;Written Material          General Cardiac Education Topics:  AED/CPR: - Group verbal and written instruction with the use of models to demonstrate the basic use of the AED with the basic ABC's of resuscitation.   Test and Procedures: - Group verbal and visual presentation and models provide information about basic cardiac anatomy and function. Reviews the testing methods done to diagnose heart disease and the outcomes of the test results. Describes the treatment choices: Medical Management, Angioplasty, or Coronary Bypass Surgery for treating various heart conditions including Myocardial Infarction, Angina, Valve Disease, and Cardiac Arrhythmias. Written material provided at class time.   Medication Safety: - Group verbal and visual instruction to review commonly prescribed medications for heart and lung disease. Reviews the medication,  class of the drug, and side effects. Includes the steps to properly store meds and maintain the prescription regimen. Written material provided at class time.   Intimacy: - Group verbal instruction through game format to discuss how heart and lung disease can affect sexual intimacy. Written material provided at class time.   Know Your Numbers and Heart Failure: - Group verbal and visual instruction to discuss disease risk factors for cardiac and pulmonary disease and treatment options.  Reviews associated critical values for Overweight/Obesity, Hypertension, Cholesterol, and Diabetes.  Discusses basics of heart failure: signs/symptoms and treatments.  Introduces Heart Failure Zone chart for action plan for heart failure. Written material provided at class time.   Infection Prevention: - Provides verbal and written material to individual with discussion of infection control including proper hand washing and proper equipment cleaning during exercise session.   Falls Prevention: - Provides verbal and written material to individual with discussion of falls prevention and safety.   Other: -Provides group and verbal instruction on various topics (see comments)   Knowledge Questionnaire Score:  Knowledge Questionnaire Score - 06/18/24 1520       Knowledge Questionnaire Score   Pre Score 23/24          Core Components/Risk Factors/Patient Goals at Admission:  Personal Goals and Risk Factors at Admission - 06/18/24 1522       Core Components/Risk Factors/Patient Goals on Admission    Weight Management Yes;Weight Maintenance    Intervention Weight Management: Provide education and appropriate resources to help participant work on and attain dietary goals.;Weight Management: Develop a combined nutrition and exercise program designed to reach desired caloric intake, while maintaining appropriate intake of nutrient and fiber, sodium and fats, and appropriate energy expenditure required for  the weight goal.    Expected Outcomes Short Term: Continue to assess and modify interventions until short term weight is achieved;Long Term: Adherence to nutrition and physical activity/exercise program aimed toward attainment of established weight goal;Weight Maintenance: Understanding of the daily nutrition guidelines, which includes 25-35% calories from fat, 7% or less cal from saturated fats, less than 200mg  cholesterol, less than 1.5gm of sodium, & 5 or more servings of fruits and vegetables daily;Understanding of distribution of calorie intake throughout the day with the consumption of 4-5 meals/snacks;Understanding recommendations for meals to include 15-35% energy as protein, 25-35% energy from fat, 35-60% energy from carbohydrates, less than 200mg  of dietary cholesterol, 20-35 gm of total fiber  daily    Hypertension Yes    Intervention Provide education on lifestyle modifcations including regular physical activity/exercise, weight management, moderate sodium restriction and increased consumption of fresh fruit, vegetables, and low fat dairy, alcohol moderation, and smoking cessation.;Monitor prescription use compliance.    Expected Outcomes Short Term: Continued assessment and intervention until BP is < 140/21mm HG in hypertensive participants. < 130/42mm HG in hypertensive participants with diabetes, heart failure or chronic kidney disease.;Long Term: Maintenance of blood pressure at goal levels.    Lipids Yes    Intervention Provide education and support for participant on nutrition & aerobic/resistive exercise along with prescribed medications to achieve LDL 70mg , HDL >40mg .    Expected Outcomes Short Term: Participant states understanding of desired cholesterol values and is compliant with medications prescribed. Participant is following exercise prescription and nutrition guidelines.;Long Term: Cholesterol controlled with medications as prescribed, with individualized exercise RX and with  personalized nutrition plan. Value goals: LDL < 70mg , HDL > 40 mg.          Education:Diabetes - Individual verbal and written instruction to review signs/symptoms of diabetes, desired ranges of glucose level fasting, after meals and with exercise. Acknowledge that pre and post exercise glucose checks will be done for 3 sessions at entry of program.   Core Components/Risk Factors/Patient Goals Review:   Goals and Risk Factor Review     Row Name 06/24/24 9167 07/04/24 0830 08/05/24 1653         Core Components/Risk Factors/Patient Goals Review   Personal Goals Review Weight Management/Obesity;Hypertension;Lipids Weight Management/Obesity;Hypertension;Lipids Weight Management/Obesity;Hypertension;Lipids     Review Cheo started cardiac rehab on 06/23/24. Raeqwon did well with exercise. Systolic BP's were noted to be in the upper 90's to low 100's. patient asymptomatic. Will continue to monitor  BP Mung started cardiac rehab on 06/23/24. Malacki is off to a good start with exercise. Vital signs have been stable. Antonis has increased his met levels Demaryius started cardiac rehab on 06/23/24 and is doing well with exercise at cardiac rehab. Vital signs have been stable. Jc has increased his met levels     Expected Outcomes Reynoldo will continue to participate in cardiac rehab for exercise, nutrition and lifestyle modifications Brevan will continue to participate in cardiac rehab for exercise, nutrition and lifestyle modifications Deitrich will continue to participate in cardiac rehab for exercise, nutrition and lifestyle modifications        Core Components/Risk Factors/Patient Goals at Discharge (Final Review):   Goals and Risk Factor Review - 08/05/24 1653       Core Components/Risk Factors/Patient Goals Review   Personal Goals Review Weight Management/Obesity;Hypertension;Lipids    Review Raju started cardiac rehab on 06/23/24 and is doing well with exercise at cardiac rehab. Vital signs have been  stable. Gustavo has increased his met levels    Expected Outcomes Raynold will continue to participate in cardiac rehab for exercise, nutrition and lifestyle modifications          ITP Comments:  ITP Comments     Row Name 06/18/24 1318 06/24/24 0819 07/04/24 0829 08/05/24 1648     ITP Comments Dr. Wilbert Bihari medical director. Introduction to pritikin education/intensive cardiac rehab. Initial orientation packet reviewed with patient. 30 Day ITP Review. Lorenzo started cardiac rehab on 06/24/24. Paeton did well with exercise. 30 Day ITP Review. Maxon started cardiac rehab on 06/24/24. Javarious is off to a good start  with exercise. 30 Day ITP Review. Jemmie started cardiac rehab on 06/24/24 and has good attendance and  participation with exercise at cardiac rehab       Comments: see ITP comments

## 2024-08-06 ENCOUNTER — Encounter (HOSPITAL_COMMUNITY)
Admission: RE | Admit: 2024-08-06 | Discharge: 2024-08-06 | Disposition: A | Discharge status: Home / Self Care | Source: Ambulatory Visit | Attending: Cardiology

## 2024-08-06 DIAGNOSIS — Z951 Presence of aortocoronary bypass graft: Secondary | ICD-10-CM

## 2024-08-06 DIAGNOSIS — Z48812 Encounter for surgical aftercare following surgery on the circulatory system: Secondary | ICD-10-CM | POA: Diagnosis not present

## 2024-08-08 ENCOUNTER — Encounter (HOSPITAL_COMMUNITY)
Admission: RE | Admit: 2024-08-08 | Discharge: 2024-08-08 | Disposition: A | Discharge status: Home / Self Care | Source: Ambulatory Visit | Attending: Cardiology

## 2024-08-08 DIAGNOSIS — Z48812 Encounter for surgical aftercare following surgery on the circulatory system: Secondary | ICD-10-CM | POA: Diagnosis not present

## 2024-08-08 DIAGNOSIS — Z951 Presence of aortocoronary bypass graft: Secondary | ICD-10-CM

## 2024-08-11 ENCOUNTER — Encounter (HOSPITAL_COMMUNITY)
Admission: RE | Admit: 2024-08-11 | Discharge: 2024-08-11 | Disposition: A | Discharge status: Home / Self Care | Source: Ambulatory Visit | Attending: Cardiology

## 2024-08-11 DIAGNOSIS — Z48812 Encounter for surgical aftercare following surgery on the circulatory system: Secondary | ICD-10-CM | POA: Diagnosis not present

## 2024-08-11 DIAGNOSIS — Z951 Presence of aortocoronary bypass graft: Secondary | ICD-10-CM

## 2024-08-13 ENCOUNTER — Encounter (HOSPITAL_COMMUNITY)
Admission: RE | Admit: 2024-08-13 | Discharge: 2024-08-13 | Disposition: A | Discharge status: Home / Self Care | Source: Ambulatory Visit | Attending: Cardiology

## 2024-08-13 DIAGNOSIS — Z48812 Encounter for surgical aftercare following surgery on the circulatory system: Secondary | ICD-10-CM | POA: Diagnosis not present

## 2024-08-13 DIAGNOSIS — Z951 Presence of aortocoronary bypass graft: Secondary | ICD-10-CM | POA: Diagnosis not present

## 2024-08-15 ENCOUNTER — Encounter (HOSPITAL_COMMUNITY)
Admission: RE | Admit: 2024-08-15 | Discharge: 2024-08-15 | Disposition: A | Discharge status: Home / Self Care | Source: Ambulatory Visit | Attending: Cardiology | Admitting: Cardiology

## 2024-08-15 DIAGNOSIS — Z48812 Encounter for surgical aftercare following surgery on the circulatory system: Secondary | ICD-10-CM | POA: Diagnosis not present

## 2024-08-18 ENCOUNTER — Encounter (HOSPITAL_COMMUNITY)
Admission: RE | Admit: 2024-08-18 | Discharge: 2024-08-18 | Disposition: A | Discharge status: Home / Self Care | Source: Ambulatory Visit | Attending: Cardiology | Admitting: Cardiology

## 2024-08-18 DIAGNOSIS — Z951 Presence of aortocoronary bypass graft: Secondary | ICD-10-CM

## 2024-08-18 DIAGNOSIS — Z48812 Encounter for surgical aftercare following surgery on the circulatory system: Secondary | ICD-10-CM | POA: Diagnosis not present

## 2024-08-20 ENCOUNTER — Other Ambulatory Visit: Payer: Self-pay | Admitting: Physician Assistant

## 2024-08-20 ENCOUNTER — Encounter (HOSPITAL_COMMUNITY)
Admission: RE | Admit: 2024-08-20 | Discharge: 2024-08-20 | Disposition: A | Discharge status: Home / Self Care | Source: Ambulatory Visit | Attending: Cardiology

## 2024-08-20 ENCOUNTER — Encounter: Payer: Self-pay | Admitting: Cardiology

## 2024-08-20 DIAGNOSIS — Z951 Presence of aortocoronary bypass graft: Secondary | ICD-10-CM

## 2024-08-20 DIAGNOSIS — Z48812 Encounter for surgical aftercare following surgery on the circulatory system: Secondary | ICD-10-CM | POA: Diagnosis not present

## 2024-08-21 MED ORDER — METOPROLOL TARTRATE 25 MG PO TABS: 12.5000 mg | ORAL_TABLET | Freq: Two times a day (BID) | ORAL | 1 refills | Status: AC

## 2024-08-22 ENCOUNTER — Encounter (HOSPITAL_COMMUNITY): Admission: RE | Admit: 2024-08-22 | Source: Ambulatory Visit

## 2024-08-25 ENCOUNTER — Encounter (HOSPITAL_COMMUNITY)
Admission: RE | Admit: 2024-08-25 | Discharge: 2024-08-25 | Disposition: A | Discharge status: Home / Self Care | Source: Ambulatory Visit | Attending: Cardiology | Admitting: Cardiology

## 2024-08-25 DIAGNOSIS — Z951 Presence of aortocoronary bypass graft: Secondary | ICD-10-CM | POA: Diagnosis not present

## 2024-08-25 DIAGNOSIS — Z48812 Encounter for surgical aftercare following surgery on the circulatory system: Secondary | ICD-10-CM | POA: Diagnosis not present

## 2024-08-27 ENCOUNTER — Encounter (HOSPITAL_COMMUNITY)
Admission: RE | Admit: 2024-08-27 | Discharge: 2024-08-27 | Disposition: A | Discharge status: Home / Self Care | Source: Ambulatory Visit | Attending: Cardiology | Admitting: Cardiology

## 2024-08-27 DIAGNOSIS — Z951 Presence of aortocoronary bypass graft: Secondary | ICD-10-CM | POA: Diagnosis not present

## 2024-08-28 ENCOUNTER — Other Ambulatory Visit: Payer: Self-pay | Admitting: Nurse Practitioner

## 2024-08-28 DIAGNOSIS — Z951 Presence of aortocoronary bypass graft: Secondary | ICD-10-CM

## 2024-08-28 DIAGNOSIS — I2510 Atherosclerotic heart disease of native coronary artery without angina pectoris: Secondary | ICD-10-CM

## 2024-08-28 DIAGNOSIS — E782 Mixed hyperlipidemia: Secondary | ICD-10-CM

## 2024-08-29 ENCOUNTER — Encounter (HOSPITAL_COMMUNITY)
Admission: RE | Admit: 2024-08-29 | Discharge: 2024-08-29 | Disposition: A | Discharge status: Home / Self Care | Source: Ambulatory Visit | Attending: Cardiology | Admitting: Cardiology

## 2024-08-29 DIAGNOSIS — Z951 Presence of aortocoronary bypass graft: Secondary | ICD-10-CM | POA: Diagnosis not present

## 2024-09-01 ENCOUNTER — Encounter (HOSPITAL_COMMUNITY)
Admission: RE | Admit: 2024-09-01 | Discharge: 2024-09-01 | Disposition: A | Discharge status: Home / Self Care | Source: Ambulatory Visit | Attending: Cardiology | Admitting: Cardiology

## 2024-09-01 DIAGNOSIS — Z951 Presence of aortocoronary bypass graft: Secondary | ICD-10-CM | POA: Diagnosis not present

## 2024-09-03 ENCOUNTER — Encounter (HOSPITAL_COMMUNITY)
Admission: RE | Admit: 2024-09-03 | Discharge: 2024-09-03 | Disposition: A | Discharge status: Home / Self Care | Source: Ambulatory Visit | Attending: Cardiology | Admitting: Cardiology

## 2024-09-03 VITALS — Ht 66.0 in | Wt 146.4 lb

## 2024-09-03 DIAGNOSIS — Z951 Presence of aortocoronary bypass graft: Secondary | ICD-10-CM | POA: Diagnosis not present

## 2024-09-03 NOTE — Progress Notes (Signed)
 Cardiac Individual Treatment Plan  Patient Details  Name: Gary Castaneda MRN: [id number] Date of Birth: 1971-06-18 Referring Provider:   Flowsheet Row INTENSIVE CARDIAC REHAB ORIENT from 06/18/2024 in 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  Patient's Home Medications on Admission:  Current Outpatient Medications:    amLODipine  (NORVASC ) 5 MG tablet, TAKE 1 TABLET (5MG ) BY MOUTH DAILY., Disp: 30 tablet, Rfl: 3   Ascorbic Acid (VITAMIN C PO), Take 1 tablet by mouth daily., Disp: , Rfl:    aspirin  EC (QC ENTERIC ASPIRIN ) 325 MG tablet, TAKE 1 TABLET (325MG ) BY MOUTH DAILY., Disp: 30 tablet, Rfl: 3   Cholecalciferol (D3 PO), Take 1 tablet by mouth daily., Disp: , Rfl:    ezetimibe  (ZETIA ) 10 MG tablet, Take 10 mg by mouth daily., Disp: , Rfl:    Ferrous Sulfate (IRON PO), Take 1 tablet by mouth daily., Disp: , Rfl:    Menaquinone-7 (K2 PO), Take 1 tablet by mouth daily., Disp: , Rfl:    metoprolol  tartrate (LOPRESSOR ) 25 MG tablet, Take 0.5 tablets (12.5 mg total) by mouth 2 (two) times daily., Disp: 90 tablet, Rfl: 1   potassium chloride  SA (KLOR-CON  M) 20 MEQ tablet, Take 1 tablet (20 mEq total) by mouth 2 (two) times daily for 1 day. (Patient not taking: Reported on 06/18/2024), Disp: 2 tablet, Rfl: 0   REPATHA  SURECLICK 140 MG/ML SOAJ, Inject 140 mg into the skin every 14 (fourteen) days., Disp: 6 mL, Rfl: 1   rosuvastatin  (CRESTOR ) 20 MG tablet, TAKE (1) TABLET DAILY AT BEDTIME., Disp: 90 tablet, Rfl: 0  Past Medical History: Past Medical History:  Diagnosis Date   Coronary artery disease    HTN (hypertension)    Hyperlipidemia LDL goal < 100     Tobacco Use: Social History   Tobacco Use   Smoking Status Never  Smokeless Tobacco Never    Labs: Review Flowsheet       Latest Ref Rng & Units 04/22/2024 04/24/2024  Labs for ITP Cardiac and Pulmonary Rehab  Hemoglobin A1c 4.8 - 5.6 % 4.9  -  PH, Arterial 7.35 - 7.45 - 7.327  7.408  7.462  7.453  7.440   PCO2 arterial 32 - 48 mmHg - 46.1  28.3  33.9  32.9  36.7   Bicarbonate 20.0 - 28.0 mmol/L - 24.5  18.0  24.2  25.3  23.0  24.9   TCO2 22 - 32 mmol/L - 26  19  25  26  27  26  24  28  25  26    Acid-base deficit 0.0 - 2.0 mmol/L - 2.0  6.0  1.0   O2 Saturation % - 98  99  100  89  100  100     Details       Multiple values from one day are sorted in reverse-chronological order         Capillary Blood Glucose: Lab Results  Component Value Date   GLUCAP 118 (H) 04/26/2024   GLUCAP 120 (H) 04/26/2024   GLUCAP 111 (H) 04/26/2024   GLUCAP 136 (H) 04/26/2024   GLUCAP 111 (H) 04/26/2024     Exercise Target Goals: Exercise Program Goal: Individual exercise prescription set  using results from initial 6 min walk test and THRR while considering  patient's activity barriers and safety.   Exercise Prescription Goal: Initial exercise prescription builds to 30-45 minutes a day of aerobic activity, 2-3 days per week.  Home exercise guidelines will be given to patient during program as part of exercise prescription that the participant will acknowledge.  Activity Barriers & Risk Stratification:  Activity Barriers & Cardiac Risk Stratification - 06/18/24 1517       Activity Barriers & Cardiac Risk Stratification   Activity Barriers Incisional Pain;Other (comment)    Comments sternal precautions    Cardiac Risk Stratification High   <5 METs on         6 Minute Walk:  6 Minute Walk     Row Name 06/18/24 1517         6 Minute Walk   Phase Initial     Distance 1340 feet     Walk Time 6 minutes     # of Rest Breaks 0     MPH 2.54     METS 1.06     RPE 9.5     Perceived Dyspnea  0     VO2 Peak 14.21      Symptoms No     Resting HR 68 bpm     Resting BP 104/68     Resting Oxygen Saturation  98 %     Exercise Oxygen Saturation  during 6 min walk 98 %     Max Ex. HR 79 bpm     Max Ex. BP 130/62     2 Minute Post BP 100/72        Oxygen Initial Assessment:   Oxygen Re-Evaluation:   Oxygen Discharge (Final Oxygen Re-Evaluation):   Initial Exercise Prescription:  Initial Exercise Prescription - 06/18/24 1500       Date of Initial Exercise RX and Referring Provider   Date 06/18/24    Referring Provider [name], MD    Expected Discharge Date 09/10/24      Treadmill   MPH 3    Grade 0    Minutes 15    METs 2.5      Recumbant Elliptical   Level 1    RPM 60    Watts 70    Minutes 15    METs 2.5      Prescription Details   Frequency (times per week) 3    Duration Progress to 30 minutes of continuous aerobic without signs/symptoms of physical distress      Intensity   THRR 40-80% of Max Heartrate 68-136    Ratings of Perceived Exertion 11-13    Perceived Dyspnea 0-4      Progression   Progression Continue progressive overload as per policy without signs/symptoms or physical distress.      Resistance Training   Training Prescription Yes    Weight 3    Reps 10-15          Perform Capillary Blood Glucose checks as needed.  Exercise Prescription Changes:   Exercise Prescription Changes     Row Name 06/23/24 1646 07/14/24 1706 07/25/24 1703 07/30/24 1630 08/13/24 1644     Response to Exercise   Blood Pressure (Admit) 106/62 102/78 102/68 108/60 110/62   Blood Pressure (Exercise) 106/70 124/72 -- -- --   Blood Pressure (Exit) 104/60 130/82 118/70 94/60 110/70   Heart Rate (Admit) 87 bpm 74 bpm 71 bpm 63 bpm 70 bpm   Heart Rate (  Exercise) 118 bpm 120 bpm 116 bpm 126 bpm 130 bpm   Heart Rate (Exit) 94 bpm 94 bpm 76 bpm 66 bpm 85 bpm   Rating of Perceived Exertion (Exercise) 9.5 7 6  7.5 6   Perceived Dyspnea (Exercise) 0 0 0 0 0   Symptoms None None None  None None   Comments Pt first day int eh [facility] I[facility] program. Reviewed MET's, goals and home ExRx REVD MET's REVD MET's, goals and gradual weight lifting progression REVD MET's   Duration Progress to 30 minutes of  aerobic without signs/symptoms of physical distress Progress to 30 minutes of  aerobic without signs/symptoms of physical distress Progress to 30 minutes of  aerobic without signs/symptoms of physical distress Progress to 30 minutes of  aerobic without signs/symptoms of physical distress Progress to 30 minutes of  aerobic without signs/symptoms of physical distress   Intensity THRR unchanged THRR unchanged THRR unchanged THRR unchanged THRR unchanged     Progression   Progression Continue to progress workloads to maintain intensity without signs/symptoms of physical distress. Continue to progress workloads to maintain intensity without signs/symptoms of physical distress. Continue to progress workloads to maintain intensity without signs/symptoms of physical distress. Continue to progress workloads to maintain intensity without signs/symptoms of physical distress. Continue to progress workloads to maintain intensity without signs/symptoms of physical distress.   Average METs 3.15 4.43 5.64 5.8 6.24     Resistance Training   Training Prescription Yes Yes Yes No No   Weight 3 3 3 3 3    Reps 10-15 10-15 10-15 10-15 10-15   Time 10 Minutes 10 Minutes 10 Minutes 10 Minutes 10 Minutes     Interval Training   Interval Training -- -- Yes Yes Yes   Equipment -- -- Treadmill Treadmill Treadmill   Comments -- -- 2 min walk, 1 min jog 2 min walk, 1 min jog 1 min walk, 2 min jog     Treadmill   MPH 3 3.5 3.5  Jog 4.2 speed, 7.43 MET's 3.5  Jog 4.2 speed, 7.43 MET's 3.5  Jog 4.2 speed, 7.43 MET's   Grade 0 1 0 0 0   Minutes 15 15 15 15 15    METs 3.3 4.16 3.68 3.68 3.68     Recumbant Elliptical   Level 1 3 5 5 6    RPM 40 51 60 63 71   Watts 53 83 101 110 128   Minutes 15 15 15 15 15     METs 3 4.7 5.8 6.3 7.6     Home Exercise Plan   Plans to continue exercise at -- [employer] (comment) [employer] (comment) [employer] (comment) [employer] (comment)   Frequency -- Add 4 additional days to program exercise sessions. Add 4 additional days to program exercise sessions. Add 4 additional days to program exercise sessions. Add 4 additional days to program exercise sessions.   Initial Home Exercises Provided -- 07/14/24 07/14/24 07/14/24 07/14/24    Row Name 08/29/24 1701             Response to Exercise   Blood Pressure (Admit) 108/68       Blood Pressure (Exit) 98/62       Heart Rate (Admit) 68 bpm       Heart Rate (Exercise) 130 bpm       Heart Rate (Exit) 74 bpm       Rating of Perceived Exertion (Exercise) 6       Perceived Dyspnea (Exercise) 0  Symptoms None       Comments REVD MET's       Duration Progress to 30 minutes of  aerobic without signs/symptoms of physical distress       Intensity THRR unchanged         Progression   Progression Continue to progress workloads to maintain intensity without signs/symptoms of physical distress.       Average METs 6.1         Resistance Training   Training Prescription Yes       Weight 8       Reps 10-15       Time 10 Minutes         Interval Training   Interval Training Yes       Equipment Treadmill       Comments 5 min jog, 5 min walk, 5 min jog         Treadmill   MPH 3.5  Jog 4.2 speed, 7.43 MET's       Grade 0       Minutes 15       METs 3.68         Elliptical   Level 6       Speed 6       Minutes 15       METs 7.2         Home Exercise Plan   Plans to continue exercise at [employer] (comment)       Frequency Add 4 additional days to program exercise sessions.       Initial Home Exercises Provided 07/14/24          Exercise Comments:   Exercise Comments     Row Name 06/23/24 1650 07/14/24 1712 07/25/24 1706 07/30/24 1630 08/13/24 1648   Exercise  Comments Pt first day in the [facility] I[facility] program. Pt tolerated exercise well with an average MEt level of 3.15. Pt is off to a good start and is learning his THRR, RPE and ExRx` Reviewed MET's, goals and home ExRx. Pt tolerated exercise well with an average MEt level of 4.43. He has been enjoying exercise, but feels he would like more of a challenge. BP's and HR's have been within limits. Will request jogging. He's sternal precautions lift 8/29 and we will review a gradual weight lifting plan then. So far he feels good with his goals and has an increase in strength and stamina. He plans to exercise on his own at [facility], walking and light hand weight for 30-45 mins 3-4 days Reviewed MET's. Pt tolerated exercise well with an average MET level of 4.43. Patient is doing well and gained jogging clearance. Jogging is going well and he is increasing WL's, MET's and endurance. Reviewed MET's and goals. Pt tolerated exercise well with an average MET level of 5.8. Pt is doing well and increasing WL's and MET's. He's doing well with his HIIT training. Reviewed gradual weight lifting progressions since his sternal precautions lifted Reviewed MET's and goals. Pt tolerated exercise well with an average MET level of 6.24. Pt is doing well and feels good with his progress. He is ready to change intervals will do 5 min jog, 5 min walk, 5 min jog on next session. Previously doing 2 min jog, 1 min walk.    Row Name 08/29/24 1659           Exercise Comments Reviewed MET's. Pt tolerated exercise well with an average MET level of 6.1. Pt  is doing well. Changed HIIT intervals to 5 min jog, 5 walk, 5 jog. Also added in upright elliptical instead of octane. He's doing well with the changes and feels good with his progress          Exercise Goals and Review:   Exercise Goals     Row Name 06/18/24 1519             Exercise Goals   Increase Physical Activity Yes       Intervention Provide advice, education, support  and counseling about physical activity/exercise needs.;Develop an individualized exercise prescription for aerobic and resistive training based on initial evaluation findings, risk stratification, comorbidities and participant's personal goals.       Expected Outcomes Long Term: Add in home exercise to make exercise part of routine and to increase amount of physical activity.;Long Term: Exercising regularly at least 3-5 days a week.;Short Term: Attend rehab on a regular basis to increase amount of physical activity.       Increase Strength and Stamina Yes       Intervention Develop an individualized exercise prescription for aerobic and resistive training based on initial evaluation findings, risk stratification, comorbidities and participant's personal goals.;Provide advice, education, support and counseling about physical activity/exercise needs.       Expected Outcomes Short Term: Increase workloads from initial exercise prescription for resistance, speed, and METs.;Long Term: Improve cardiorespiratory fitness, muscular endurance and strength as measured by increased METs and functional capacity ( );Short Term: Perform resistance training exercises routinely during rehab and add in resistance training at home       Able to understand and use rate of perceived exertion (RPE) scale Yes       Intervention Provide education and explanation on how to use RPE scale       Expected Outcomes Short Term: Able to use RPE daily in rehab to express subjective intensity level;Long Term:  Able to use RPE to guide intensity level when exercising independently       Knowledge and understanding of Target Heart Rate Range (THRR) Yes       Intervention Provide education and explanation of THRR including how the numbers were predicted and where they are located for reference       Expected Outcomes Long Term: Able to use THRR to govern intensity when exercising independently;Short Term: Able to state/look up THRR;Short  Term: Able to use daily as guideline for intensity in rehab       Understanding of Exercise Prescription Yes       Intervention Provide education, explanation, and written materials on patient's individual exercise prescription       Expected Outcomes Short Term: Able to explain program exercise prescription;Long Term: Able to explain home exercise prescription to exercise independently          Exercise Goals Re-Evaluation :  Exercise Goals Re-Evaluation     Row Name 06/23/24 1649 07/14/24 1709 07/30/24 1630         Exercise Goal Re-Evaluation   Exercise Goals Review Increase Physical Activity;Understanding of Exercise Prescription;Increase Strength and Stamina;Knowledge and understanding of Target Heart Rate Range (THRR);Able to understand and use rate of perceived exertion (RPE) scale Increase Physical Activity;Understanding of Exercise Prescription;Increase Strength and Stamina;Knowledge and understanding of Target Heart Rate Range (THRR);Able to understand and use rate of perceived exertion (RPE) scale Increase Physical Activity;Understanding of Exercise Prescription;Increase Strength and Stamina;Knowledge and understanding of Target Heart Rate Range (THRR);Able to understand and use rate of perceived exertion (RPE) scale  Comments Pt first day in the [facility] I[facility] program. Pt tolerated exercise well with an average MEt level of 3.15. Pt is off to a good start and is learning his THRR, RPE and ExRx` Reviewed MET's, goals and home ExRx. Pt tolerated exercise well with an average MEt level of 4.43. He has been enjoying exercise, but feels he would like more of a challenge. BP's and HR's have been within limits. Will request jogging. He's sternal precautions lift 8/29 and we will review a gradual weight lifting plan then. So far he feels good with his goals and has an increase in strength and stamina. He plans to exercise on his own at [facility], walking and light hand weight for 30-45 mins 3-4  days Reviewed MET's and goals. Pt tolerated exercise well with an average MET level of 5.8. Pt is doing well and increasing WL's and MET's. He's doing well with his HIIT training. Reviewed gradual weight lifting progressions since his sternal precautions lifted     Expected Outcomes Will continue to monitor pt anf progress workloads as tolerated without sign or symptom Will continue to monitor pt anf progress workloads as tolerated without sign or symptom Will continue to monitor pt anf progress workloads as tolerated without sign or symptom        Discharge Exercise Prescription (Final Exercise Prescription Changes):  Exercise Prescription Changes - 08/29/24 1701       Response to Exercise   Blood Pressure (Admit) 108/68    Blood Pressure (Exit) 98/62    Heart Rate (Admit) 68 bpm    Heart Rate (Exercise) 130 bpm    Heart Rate (Exit) 74 bpm    Rating of Perceived Exertion (Exercise) 6    Perceived Dyspnea (Exercise) 0    Symptoms None    Comments REVD MET's    Duration Progress to 30 minutes of  aerobic without signs/symptoms of physical distress    Intensity THRR unchanged      Progression   Progression Continue to progress workloads to maintain intensity without signs/symptoms of physical distress.    Average METs 6.1      Resistance Training   Training Prescription Yes    Weight 8    Reps 10-15    Time 10 Minutes      Interval Training   Interval Training Yes    Equipment Treadmill    Comments 5 min jog, 5 min walk, 5 min jog      Treadmill   MPH 3.5   Jog 4.2 speed, 7.43 MET's   Grade 0    Minutes 15    METs 3.68      Elliptical   Level 6    Speed 6    Minutes 15    METs 7.2      Home Exercise Plan   Plans to continue exercise at [employer] (comment)    Frequency Add 4 additional days to program exercise sessions.    Initial Home Exercises Provided 07/14/24          Nutrition:  Target Goals: Understanding of nutrition guidelines, daily intake of  sodium 1500mg , cholesterol 200mg , calories 30% from fat and 7% or less from saturated fats, daily to have 5 or more servings of fruits and vegetables.  Biometrics:  Pre Biometrics - 06/18/24 1508       Pre Biometrics   Waist Circumference 35 inches    Hip Circumference 36 inches    Waist to Hip Ratio 0.97 %  Triceps Skinfold 12 mm    % Body Fat 22.3 %    Grip Strength 24 kg    Flexibility 12.25 in    Single Leg Stand 9.52 seconds           Nutrition Therapy Plan and Nutrition Goals:  Nutrition Therapy & Goals - 06/23/24 1603       Nutrition Therapy   Diet Heart healthy diet    Drug/Food Interactions Statins/Certain Fruits      Personal Nutrition Goals   Nutrition Goal Patient to identify strategies for reducing cardiovascular risk by attending the [facility] education and nutrition series weekly.    Personal Goal #2 Patient to improve diet quality by using the plate method as a guide for meal planning to include lean protein/plant protein, fruits, vegetables, whole grains, nonfat dairy as part of a well-balanced diet.    Comments Patient has medical history of CAD s/p CABG x 4 , HTN. Lipid are at goal; he continues repatha , crestor , zetia . Patient will benefit from participation in intensive cardiac rehab for nutrition education, exercise, and lifestyle modification.      Intervention Plan   Intervention Prescribe, educate and counsel regarding individualized specific dietary modifications aiming towards targeted core components such as weight, hypertension, lipid management, diabetes, heart failure and other comorbidities.;Nutrition handout(s) given to patient.    Expected Outcomes Short Term Goal: Understand basic principles of dietary content, such as calories, fat, sodium, cholesterol and nutrients.;Long Term Goal: Adherence to prescribed nutrition plan.          Nutrition Assessments:  MEDIFICTS Score Key: >=70 Need to make dietary changes  40-70 Heart Healthy  Diet <= 40 Therapeutic Level Cholesterol Diet   Flowsheet Row INTENSIVE CARDIAC REHAB ORIENT from 06/18/2024 in [facility]  Picture Your Plate Total Score on Admission 63   Picture Your Plate Scores: <59 Unhealthy dietary pattern with much room for improvement. 41-50 Dietary pattern unlikely to meet recommendations for good health and room for improvement. 51-60 More healthful dietary pattern, with some room for improvement.  >60 Healthy dietary pattern, although there may be some specific behaviors that could be improved.    Nutrition Goals Re-Evaluation:  Nutrition Goals Re-Evaluation     Row Name 06/23/24 1603             Goals   Current Weight 145 lb 4.5 oz (65.9 kg)       Comment LDL 40, HDL 58, A1c WNL       Expected Outcome Patient has medical history of CAD s/p CABG x 4 , HTN. Lipid are at goal; he continues repatha , crestor , zetia . Patient will benefit from participation in intensive cardiac rehab for nutrition education, exercise, and lifestyle modification.          Nutrition Goals Re-Evaluation:  Nutrition Goals Re-Evaluation     Row Name 06/23/24 1603             Goals   Current Weight 145 lb 4.5 oz (65.9 kg)       Comment LDL 40, HDL 58, A1c WNL       Expected Outcome Patient has medical history of CAD s/p CABG x 4 , HTN. Lipid are at goal; he continues repatha , crestor , zetia . Patient will benefit from participation in intensive cardiac rehab for nutrition education, exercise, and lifestyle modification.          Nutrition Goals Discharge (Final Nutrition Goals Re-Evaluation):  Nutrition Goals Re-Evaluation -  06/23/24 1603       Goals   Current Weight 145 lb 4.5 oz (65.9 kg)    Comment LDL 40, HDL 58, A1c WNL    Expected Outcome Patient has medical history of CAD s/p CABG x 4 , HTN. Lipid are at goal; he continues repatha , crestor , zetia . Patient will benefit from participation in intensive  cardiac rehab for nutrition education, exercise, and lifestyle modification.          Psychosocial: Target Goals: Acknowledge presence or absence of significant depression and/or stress, maximize coping skills, provide positive support system. Participant is able to verbalize types and ability to use techniques and skills needed for reducing stress and depression.  Initial Review & Psychosocial Screening:  Initial Psych Review & Screening - 06/18/24 1520       Initial Review   Current issues with None Identified      Family Dynamics   Good Support System? Yes   wife     Barriers   Psychosocial barriers to participate in program There are no identifiable barriers or psychosocial needs.      Screening Interventions   Interventions Provide feedback about the scores to participant;Encouraged to exercise    Expected Outcomes Long Term goal: The participant improves quality of Life and PHQ9 Scores as seen by post scores and/or verbalization of changes;Short Term goal: Identification and review with participant of any Quality of Life or Depression concerns found by scoring the questionnaire.          Quality of Life Scores:  Quality of Life - 06/18/24 1520       Quality of Life   Select Quality of Life      Quality of Life Scores   Health/Function Pre 19.8 %    Socioeconomic Pre 25.36 %    Psych/Spiritual Pre 21.93 %    Family Pre 25.2 %    GLOBAL Pre 22.18 %         Scores of 19 and below usually indicate a poorer quality of life in these areas.  A difference of  2-3 points is a clinically meaningful difference.  A difference of 2-3 points in the total score of the Quality of Life Index has been associated with significant improvement in overall quality of life, self-image, physical symptoms, and general health in studies assessing change in quality of life.  PHQ-9: Review Flowsheet       06/18/2024  Depression screen PHQ 2/9  Decreased Interest 0  Down, Depressed,  Hopeless 0  PHQ - 2 Score 0  Altered sleeping 0  Tired, decreased energy 0  Change in appetite 0  Feeling bad or failure about yourself  0  Trouble concentrating 0  Moving slowly or fidgety/restless 0  Suicidal thoughts 0  PHQ-9 Score 0   Interpretation of Total Score  Total Score Depression Severity:  1-4 = Minimal depression, 5-9 = Mild depression, 10-14 = Moderate depression, 15-19 = Moderately severe depression, 20-27 = Severe depression   Psychosocial Evaluation and Intervention:   Psychosocial Re-Evaluation:  Psychosocial Re-Evaluation     Row Name 06/24/24 [phone] 07/04/24 9170 08/05/24 1649 09/03/24 1317       Psychosocial Re-Evaluation   Current issues with None Identified None Identified None Identified None Identified    Comments -- -- Gunnard has not voiced any psychosocial concerns or stressors during exercise at cardiac rehab. Jacarie has not voiced any psychosocial concerns or stressors during exercise at cardiac rehab. Decorian will complete cardiac rehab tenatively on  09/11/24    Expected Outcomes -- -- Harald will continue to manage all psychosocial concerns and stressors that are made known during cardiac rehab. --    Interventions Encouraged to attend Cardiac Rehabilitation for the exercise Encouraged to attend Cardiac Rehabilitation for the exercise Encouraged to attend Cardiac Rehabilitation for the exercise Encouraged to attend Cardiac Rehabilitation for the exercise    Continue Psychosocial Services  No Follow up required No Follow up required No Follow up required No Follow up required       Psychosocial Discharge (Final Psychosocial Re-Evaluation):  Psychosocial Re-Evaluation - 09/03/24 1317       Psychosocial Re-Evaluation   Current issues with None Identified    Comments Ernie has not voiced any psychosocial concerns or stressors during exercise at cardiac rehab. Brysyn will complete cardiac rehab tenatively on 09/11/24    Interventions Encouraged to attend  Cardiac Rehabilitation for the exercise    Continue Psychosocial Services  No Follow up required          Vocational Rehabilitation: Provide vocational rehab assistance to qualifying candidates.   Vocational Rehab Evaluation & Intervention:  Vocational Rehab - 06/18/24 1521       Initial Vocational Rehab Evaluation & Intervention   Assessment shows need for Vocational Rehabilitation No   working         Education: Education Goals: Education classes will be provided on a weekly basis, covering required topics. Participant will state understanding/return demonstration of topics presented.    Education     Row Name 06/23/24 1500     Education   Cardiac Education Topics Pritikin   Nurse, children's Exercise Physiologist   Select Psychosocial   Psychosocial Healthy Minds, Bodies, Hearts   Instruction Review Code 1- Verbalizes Understanding   Class Start Time 1415   Class Stop Time 1450   Class Time Calculation (min) 35 min    Row Name 06/25/24 1400     Education   Cardiac Education Topics Pritikin   Customer service manager   Weekly Topic Adding Flavor - Sodium-Free   Instruction Review Code 1- Verbalizes Understanding   Class Start Time 1345   Class Stop Time 1435   Class Time Calculation (min) 50 min    Row Name 06/27/24 1400     Education   Cardiac Education Topics Pritikin   Select Workshops     Workshops   Educator Exercise Physiologist   Select Exercise   Exercise Workshop Location manager and Fall Prevention   Instruction Review Code 1- Verbalizes Understanding   Class Start Time 1402   Class Stop Time 1441   Class Time Calculation (min) 39 min    Row Name 06/30/24 1500     Education   Cardiac Education Topics Pritikin   Glass blower/designer Nutrition   Nutrition Workshop Label Reading   Instruction Review Code 1- Merchant navy officer   Class Start Time 1400   Class Stop Time 1450   Class Time Calculation (min) 50 min    Row Name 07/02/24 1500     Education   Cardiac Education Topics Pritikin   Customer service manager   Weekly Topic Fast and Healthy Breakfasts   Instruction Review Code 1- Verbalizes Understanding   Class Start Time 1400  Class Stop Time 1440   Class Time Calculation (min) 40 min    Row Name 07/04/24 1500     Education   Cardiac Education Topics Pritikin   Select Core Videos     Core Videos   Educator Dietitian   Select Nutrition   Nutrition Other   Instruction Review Code 1- Verbalizes Understanding   Class Start Time 1400   Class Stop Time 1445   Class Time Calculation (min) 45 min    Row Name 07/14/24 1400     Education   Cardiac Education Topics Pritikin   Select Workshops     Workshops   Educator Exercise Physiologist   Select Psychosocial   Psychosocial Workshop Recognizing and Reducing Stress   Instruction Review Code 1- Verbalizes Understanding   Class Start Time 1405   Class Stop Time 1448   Class Time Calculation (min) 43 min    Row Name 07/16/24 1500     Education   Cardiac Education Topics Pritikin   Customer service manager   Weekly Topic [employer] Desserts   Instruction Review Code 1- Verbalizes Understanding   Class Start Time 1400   Class Stop Time 1450   Class Time Calculation (min) 50 min    Row Name 07/18/24 1500     Education   Cardiac Education Topics Pritikin   Licensed conveyancer Nutrition   Nutrition Calorie Density   Instruction Review Code 1- Verbalizes Understanding   Class Start Time 1400   Class Stop Time 1435   Class Time Calculation (min) 35 min    Row Name 07/21/24 1400     Education   Cardiac Education Topics Pritikin   Consulting civil engineer Exercise   Exercise Workshop Exercise Basics: Diplomatic Services operational officer   Instruction Review Code 1- Verbalizes Understanding   Class Start Time 1420   Class Stop Time 1500   Class Time Calculation (min) 40 min    Row Name 07/23/24 1500     Education   Cardiac Education Topics Pritikin   Customer service manager   Weekly Topic Efficiency Cooking - Meals in a Snap   Instruction Review Code 1- Verbalizes Understanding   Class Start Time 1400   Class Stop Time 1440   Class Time Calculation (min) 40 min    Row Name 07/25/24 1400     Education   Cardiac Education Topics Pritikin   Psychologist, forensic Exercise Education   Exercise Education Move It!   Instruction Review Code 1- Verbalizes Understanding   Class Start Time 1359   Class Stop Time 1432   Class Time Calculation (min) 33 min    Row Name 07/30/24 1400     Education   Cardiac Education Topics Pritikin   Orthoptist   Educator Dietitian   Weekly Topic One-Pot Wonders   Instruction Review Code 1- Verbalizes Understanding   Class Start Time 1400   Class Stop Time 1440   Class Time Calculation (min) 40 min    Row Name 08/01/24 1500     Education   Cardiac Education Topics Pritikin   Psychologist, sport and exercise  Core Videos   Educator Nurse   Select General Education   General Education Hypertension and Heart Disease   Instruction Review Code 1- Verbalizes Understanding   Class Start Time 1403   Class Stop Time 1440   Class Time Calculation (min) 37 min    Row Name 08/04/24 1500     Education   Cardiac Education Topics Pritikin   Geographical information systems officer Psychosocial   Psychosocial Workshop Focused Goals, Sustainable Changes   Instruction Review Code 1- Verbalizes Understanding   Class Start Time 1359   Class Stop Time  1434   Class Time Calculation (min) 35 min    Row Name 08/06/24 1400     Education   Cardiac Education Topics Pritikin   Customer service manager   Weekly Topic Comforting Weekend Breakfasts   Instruction Review Code 1- Verbalizes Understanding   Class Start Time 1358   Class Stop Time 1439   Class Time Calculation (min) 41 min    Row Name 08/08/24 1400     Education   Cardiac Education Topics Pritikin   Nurse, children's Exercise Physiologist   Select Nutrition   Nutrition Dining Out - Part 1   Instruction Review Code 1- Verbalizes Understanding   Class Start Time 1400   Class Stop Time 1440   Class Time Calculation (min) 40 min    Row Name 08/11/24 1400     Education   Cardiac Education Topics Pritikin   Psychologist, forensic Exercise Education   Exercise Education Biomechanial Limitations   Instruction Review Code 1- Verbalizes Understanding   Class Start Time 1405   Class Stop Time 1440   Class Time Calculation (min) 35 min    Row Name 08/13/24 1500     Education   Cardiac Education Topics Pritikin   Secondary school teacher School   Educator Respiratory Therapist;Nurse   Weekly Topic Fast Evening Meals   Instruction Review Code 1- Verbalizes Understanding   Class Start Time 1356   Class Stop Time 1428   Class Time Calculation (min) 32 min    Row Name 08/15/24 1400     Education   Cardiac Education Topics Pritikin   Licensed conveyancer Nutrition   Nutrition Vitamins and Minerals   Instruction Review Code 1- Verbalizes Understanding   Class Start Time 1357   Class Stop Time 1440   Class Time Calculation (min) 43 min    Row Name 08/18/24 1400     Education   Cardiac Education Topics Pritikin   Therapist, art Exercise Education   Exercise Education Improving Performance   Instruction Review Code 1- Verbalizes Understanding   Class Start Time 1400   Class Stop Time 1438   Class Time Calculation (min) 38 min    Row Name 08/20/24 1500     Education   Cardiac Education Topics Pritikin   Customer service manager   Weekly Topic International Cuisine- Spotlight on the [employer] Zones   Instruction Review Code 1- [employer]  Understanding   Class Start Time 1358   Class Stop Time 1435   Class Time Calculation (min) 37 min    Row Name 08/25/24 1400     Education   Cardiac Education Topics Pritikin   Geographical information systems officer Psychosocial   Psychosocial Workshop Healthy Sleep for a Healthy Heart   Instruction Review Code 1- Verbalizes Understanding   Class Start Time 1400   Class Stop Time 1445   Class Time Calculation (min) 45 min    Row Name 08/27/24 1400     Education   Cardiac Education Topics Pritikin   Orthoptist   Educator Dietitian;Respiratory Therapist   Weekly Topic Simple Sides and Sauces   Instruction Review Code 1- Verbalizes Understanding   Class Start Time 1400   Class Stop Time 1435   Class Time Calculation (min) 35 min    Row Name 08/29/24 1400     Education   Nurse, children's Exercise Physiologist   Select Psychosocial   Psychosocial How Our Thoughts Can Heal Our Hearts   Instruction Review Code 1- Verbalizes Understanding   Class Start Time 1400   Class Stop Time 1436   Class Time Calculation (min) 36 min    Row Name 09/01/24 1400     Education   Cardiac Education Topics Pritikin   Hospital doctor Education   General Education Heart Disease Risk Reduction   Instruction Review Code 1- Verbalizes Understanding   Class Start Time  1410   Class Stop Time 1450   Class Time Calculation (min) 40 min      Core Videos: Exercise    Move It!  Clinical staff conducted group or individual video education with verbal and written material and guidebook.  Patient learns the recommended Pritikin exercise program. Exercise with the goal of living a long, healthy life. Some of the health benefits of exercise include controlled diabetes, healthier blood pressure levels, improved cholesterol levels, improved heart and lung capacity, improved sleep, and better body composition. Everyone should speak with their doctor before starting or changing an exercise routine.  Biomechanical Limitations Clinical staff conducted group or individual video education with verbal and written material and guidebook.  Patient learns how biomechanical limitations can impact exercise and how we can mitigate and possibly overcome limitations to have an impactful and balanced exercise routine.  Body Composition Clinical staff conducted group or individual video education with verbal and written material and guidebook.  Patient learns that body composition (ratio of muscle mass to fat mass) is a key component to assessing overall fitness, rather than body weight alone. Increased fat mass, especially visceral belly fat, can put us  at increased risk for metabolic syndrome, type 2 diabetes, heart disease, and even death. It is recommended to combine diet and exercise (cardiovascular and resistance training) to improve your body composition. Seek guidance from your physician and exercise physiologist before implementing an exercise routine.  Exercise Action Plan Clinical staff conducted group or individual video education with verbal and written material and guidebook.  Patient learns the recommended strategies to achieve and enjoy long-term exercise adherence, including variety, self-motivation, self-efficacy, and positive decision making. Benefits of exercise  include fitness, good health, weight management, more energy, better sleep, less stress, and overall well-being.  Medical   Heart Disease Risk Reduction Clinical staff conducted group or individual video education with verbal and written material and guidebook.  Patient learns our heart is our most vital organ as it circulates oxygen, nutrients, white blood cells, and hormones throughout the entire body, and carries waste away. Data supports a plant-based eating plan like the Pritikin Program for its effectiveness in slowing progression of and reversing heart disease. The video provides a number of recommendations to address heart disease.   Metabolic Syndrome and Belly Fat  Clinical staff conducted group or individual video education with verbal and written material and guidebook.  Patient learns what metabolic syndrome is, how it leads to heart disease, and how one can reverse it and keep it from coming back. You have metabolic syndrome if you have 3 of the following 5 criteria: abdominal obesity, high blood pressure, high triglycerides, low HDL cholesterol, and high blood sugar.  Hypertension and Heart Disease Clinical staff conducted group or individual video education with verbal and written material and guidebook.  Patient learns that high blood pressure, or hypertension, is very common in the [place] . Hypertension is largely due to excessive salt intake, but other important risk factors include being overweight, physical inactivity, drinking too much alcohol, smoking, and not eating enough potassium from fruits and vegetables. High blood pressure is a leading risk factor for heart attack, stroke, congestive heart failure, dementia, kidney failure, and premature death. Long-term effects of excessive salt intake include stiffening of the arteries and thickening of heart muscle and organ damage. Recommendations include ways to reduce hypertension and the risk of heart disease.  Diseases of  Our Time - Focusing on Diabetes Clinical staff conducted group or individual video education with verbal and written material and guidebook.  Patient learns why the best way to stop diseases of our time is prevention, through food and other lifestyle changes. Medicine (such as prescription pills and surgeries) is often only a Band-Aid on the problem, not a long-term solution. Most common diseases of our time include obesity, type 2 diabetes, hypertension, heart disease, and cancer. The Pritikin Program is recommended and has been proven to help reduce, reverse, and/or prevent the damaging effects of metabolic syndrome.  Nutrition   Overview of the Pritikin Eating Plan  Clinical staff conducted group or individual video education with verbal and written material and guidebook.  Patient learns about the Pritikin Eating Plan for disease risk reduction. The Pritikin Eating Plan emphasizes a wide variety of unrefined, minimally-processed carbohydrates, like fruits, vegetables, whole grains, and legumes. Go, Caution, and Stop food choices are explained. Plant-based and lean animal proteins are emphasized. Rationale provided for low sodium intake for blood pressure control, low added sugars for blood sugar stabilization, and low added fats and oils for coronary artery disease risk reduction and weight management.  Calorie Density  Clinical staff conducted group or individual video education with verbal and written material and guidebook.  Patient learns about calorie density and how it impacts the Pritikin Eating Plan. Knowing the characteristics of the food you choose will help you decide whether those foods will lead to weight gain or weight loss, and whether you want to consume more or less of them. Weight loss is usually a side effect of the Pritikin Eating Plan because of its focus on low calorie-dense foods.  Label Reading  Clinical staff conducted group or individual video education with verbal and  written material and guidebook.  Patient learns about the Pritikin recommended label reading guidelines  and corresponding recommendations regarding calorie density, added sugars, sodium content, and whole grains.  Dining Out - Part 1  Clinical staff conducted group or individual video education with verbal and written material and guidebook.  Patient learns that restaurant meals can be sabotaging because they can be so high in calories, fat, sodium, and/or sugar. Patient learns recommended strategies on how to positively address this and avoid unhealthy pitfalls.  Facts on Fats  Clinical staff conducted group or individual video education with verbal and written material and guidebook.  Patient learns that lifestyle modifications can be just as effective, if not more so, as many medications for lowering your risk of heart disease. A Pritikin lifestyle can help to reduce your risk of inflammation and atherosclerosis (cholesterol build-up, or plaque, in the artery walls). Lifestyle interventions such as dietary choices and physical activity address the cause of atherosclerosis. A review of the types of fats and their impact on blood cholesterol levels, along with dietary recommendations to reduce fat intake is also included.  Nutrition Action Plan  Clinical staff conducted group or individual video education with verbal and written material and guidebook.  Patient learns how to incorporate Pritikin recommendations into their lifestyle. Recommendations include planning and keeping personal health goals in mind as an important part of their success.  Healthy Mind-Set    Healthy Minds, Bodies, Hearts  Clinical staff conducted group or individual video education with verbal and written material and guidebook.  Patient learns how to identify when they are stressed. Video will discuss the impact of that stress, as well as the many benefits of stress management. Patient will also be introduced to stress  management techniques. The way we think, act, and feel has an impact on our hearts.  How Our Thoughts Can Heal Our Hearts  Clinical staff conducted group or individual video education with verbal and written material and guidebook.  Patient learns that negative thoughts can cause depression and anxiety. This can result in negative lifestyle behavior and serious health problems. Cognitive behavioral therapy is an effective method to help control our thoughts in order to change and improve our emotional outlook.  Additional Videos:  Exercise    Improving Performance  Clinical staff conducted group or individual video education with verbal and written material and guidebook.  Patient learns to use a non-linear approach by alternating intensity levels and lengths of time spent exercising to help burn more calories and lose more body fat. Cardiovascular exercise helps improve heart health, metabolism, hormonal balance, blood sugar control, and recovery from fatigue. Resistance training improves strength, endurance, balance, coordination, reaction time, metabolism, and muscle mass. Flexibility exercise improves circulation, posture, and balance. Seek guidance from your physician and exercise physiologist before implementing an exercise routine and learn your capabilities and proper form for all exercise.  Introduction to Yoga  Clinical staff conducted group or individual video education with verbal and written material and guidebook.  Patient learns about yoga, a discipline of the coming together of mind, breath, and body. The benefits of yoga include improved flexibility, improved range of motion, better posture and core strength, increased lung function, weight loss, and positive self-image. Yoga's heart health benefits include lowered blood pressure, healthier heart rate, decreased cholesterol and triglyceride levels, improved immune function, and reduced stress. Seek guidance from your physician and  exercise physiologist before implementing an exercise routine and learn your capabilities and proper form for all exercise.  Medical   Aging: Enhancing Your Quality of Life  Clinical staff conducted group  or individual video education with verbal and written material and guidebook.  Patient learns key strategies and recommendations to stay in good physical health and enhance quality of life, such as prevention strategies, having an advocate, securing a Health Care Proxy and Power of Attorney, and keeping a list of medications and system for tracking them. It also discusses how to avoid risk for bone loss.  Biology of Weight Control  Clinical staff conducted group or individual video education with verbal and written material and guidebook.  Patient learns that weight gain occurs because we consume more calories than we burn (eating more, moving less). Even if your body weight is normal, you may have higher ratios of fat compared to muscle mass. Too much body fat puts you at increased risk for cardiovascular disease, heart attack, stroke, type 2 diabetes, and obesity-related cancers. In addition to exercise, following the Pritikin Eating Plan can help reduce your risk.  Decoding Lab Results  Clinical staff conducted group or individual video education with verbal and written material and guidebook.  Patient learns that lab test reflects one measurement whose values change over time and are influenced by many factors, including medication, stress, sleep, exercise, food, hydration, pre-existing medical conditions, and more. It is recommended to use the knowledge from this video to become more involved with your lab results and evaluate your numbers to speak with your doctor.   Diseases of Our Time - Overview  Clinical staff conducted group or individual video education with verbal and written material and guidebook.  Patient learns that according to the CDC, 50% to 70% of chronic diseases (such as  obesity, type 2 diabetes, elevated lipids, hypertension, and heart disease) are avoidable through lifestyle improvements including healthier food choices, listening to satiety cues, and increased physical activity.  Sleep Disorders Clinical staff conducted group or individual video education with verbal and written material and guidebook.  Patient learns how good quality and duration of sleep are important to overall health and well-being. Patient also learns about sleep disorders and how they impact health along with recommendations to address them, including discussing with a physician.  Nutrition  Dining Out - Part 2 Clinical staff conducted group or individual video education with verbal and written material and guidebook.  Patient learns how to plan ahead and communicate in order to maximize their dining experience in a healthy and nutritious manner. Included are recommended food choices based on the type of restaurant the patient is visiting.   Fueling a Banker conducted group or individual video education with verbal and written material and guidebook.  There is a strong connection between our food choices and our health. Diseases like obesity and type 2 diabetes are very prevalent and are in large-part due to lifestyle choices. The Pritikin Eating Plan provides plenty of food and hunger-curbing satisfaction. It is easy to follow, affordable, and helps reduce health risks.  Menu Workshop  Clinical staff conducted group or individual video education with verbal and written material and guidebook.  Patient learns that restaurant meals can sabotage health goals because they are often packed with calories, fat, sodium, and sugar. Recommendations include strategies to plan ahead and to communicate with the manager, chef, or server to help order a healthier meal.  Planning Your Eating Strategy  Clinical staff conducted group or individual video education with verbal and  written material and guidebook.  Patient learns about the Pritikin Eating Plan and its benefit of reducing the risk of disease. The Pritikin  Eating Plan does not focus on calories. Instead, it emphasizes high-quality, nutrient-rich foods. By knowing the characteristics of the foods, we choose, we can determine their calorie density and make informed decisions.  Targeting Your Nutrition Priorities  Clinical staff conducted group or individual video education with verbal and written material and guidebook.  Patient learns that lifestyle habits have a tremendous impact on disease risk and progression. This video provides eating and physical activity recommendations based on your personal health goals, such as reducing LDL cholesterol, losing weight, preventing or controlling type 2 diabetes, and reducing high blood pressure.  Vitamins and Minerals  Clinical staff conducted group or individual video education with verbal and written material and guidebook.  Patient learns different ways to obtain key vitamins and minerals, including through a recommended healthy diet. It is important to discuss all supplements you take with your doctor.   Healthy Mind-Set    Smoking Cessation  Clinical staff conducted group or individual video education with verbal and written material and guidebook.  Patient learns that cigarette smoking and tobacco addiction pose a serious health risk which affects millions of people. Stopping smoking will significantly reduce the risk of heart disease, lung disease, and many forms of cancer. Recommended strategies for quitting are covered, including working with your doctor to develop a successful plan.  Culinary   Becoming a Set designer conducted group or individual video education with verbal and written material and guidebook.  Patient learns that cooking at home can be healthy, cost-effective, quick, and puts them in control. Keys to cooking healthy recipes  will include looking at your recipe, assessing your equipment needs, planning ahead, making it simple, choosing cost-effective seasonal ingredients, and limiting the use of added fats, salts, and sugars.  Cooking - Breakfast and Snacks  Clinical staff conducted group or individual video education with verbal and written material and guidebook.  Patient learns how important breakfast is to satiety and nutrition through the entire day. Recommendations include key foods to eat during breakfast to help stabilize blood sugar levels and to prevent overeating at meals later in the day. Planning ahead is also a key component.  Cooking - Educational psychologist conducted group or individual video education with verbal and written material and guidebook.  Patient learns eating strategies to improve overall health, including an approach to cook more at home. Recommendations include thinking of animal protein as a side on your plate rather than center stage and focusing instead on lower calorie dense options like vegetables, fruits, whole grains, and plant-based proteins, such as beans. Making sauces in large quantities to freeze for later and leaving the skin on your vegetables are also recommended to maximize your experience.  Cooking - Healthy Salads and Dressing Clinical staff conducted group or individual video education with verbal and written material and guidebook.  Patient learns that vegetables, fruits, whole grains, and legumes are the foundations of the Pritikin Eating Plan. Recommendations include how to incorporate each of these in flavorful and healthy salads, and how to create homemade salad dressings. Proper handling of ingredients is also covered. Cooking - Soups and [employer] - Soups and Desserts Clinical staff conducted group or individual video education with verbal and written material and guidebook.  Patient learns that Pritikin soups and desserts make for easy, nutritious,  and delicious snacks and meal components that are low in sodium, fat, sugar, and calorie density, while high in vitamins, minerals, and filling fiber. Recommendations include simple  and healthy ideas for soups and desserts.   Overview     The Pritikin Solution Program Overview Clinical staff conducted group or individual video education with verbal and written material and guidebook.  Patient learns that the results of the Pritikin Program have been documented in more than 100 articles published in peer-reviewed journals, and the benefits include reducing risk factors for (and, in some cases, even reversing) high cholesterol, high blood pressure, type 2 diabetes, obesity, and more! An overview of the three key pillars of the Pritikin Program will be covered: eating well, doing regular exercise, and having a healthy mind-set.  WORKSHOPS  Exercise: Exercise Basics: Building Your Action Plan Clinical staff led group instruction and group discussion with PowerPoint presentation and patient guidebook. To enhance the learning environment the use of posters, models and videos may be added. At the conclusion of this workshop, patients will comprehend the difference between physical activity and exercise, as well as the benefits of incorporating both, into their routine. Patients will understand the FITT (Frequency, Intensity, Time, and Type) principle and how to use it to build an exercise action plan. In addition, safety concerns and other considerations for exercise and cardiac rehab will be addressed by the presenter. The purpose of this lesson is to promote a comprehensive and effective weekly exercise routine in order to improve patients' overall level of fitness.   Managing Heart Disease: Your Path to a Healthier Heart Clinical staff led group instruction and group discussion with PowerPoint presentation and patient guidebook. To enhance the learning environment the use of posters, models and  videos may be added.At the conclusion of this workshop, patients will understand the anatomy and physiology of the heart. Additionally, they will understand how Pritikin's three pillars impact the risk factors, the progression, and the management of heart disease.  The purpose of this lesson is to provide a high-level overview of the heart, heart disease, and how the Pritikin lifestyle positively impacts risk factors.  Exercise Biomechanics Clinical staff led group instruction and group discussion with PowerPoint presentation and patient guidebook. To enhance the learning environment the use of posters, models and videos may be added. Patients will learn how the structural parts of their bodies function and how these functions impact their daily activities, movement, and exercise. Patients will learn how to promote a neutral spine, learn how to manage pain, and identify ways to improve their physical movement in order to promote healthy living. The purpose of this lesson is to expose patients to common physical limitations that impact physical activity. Participants will learn practical ways to adapt and manage aches and pains, and to minimize their effect on regular exercise. Patients will learn how to maintain good posture while sitting, walking, and lifting.  Balance Training and Fall Prevention  Clinical staff led group instruction and group discussion with PowerPoint presentation and patient guidebook. To enhance the learning environment the use of posters, models and videos may be added. At the conclusion of this workshop, patients will understand the importance of their sensorimotor skills (vision, proprioception, and the vestibular system) in maintaining their ability to balance as they age. Patients will apply a variety of balancing exercises that are appropriate for their current level of function. Patients will understand the common causes for poor balance, possible solutions to these  problems, and ways to modify their physical environment in order to minimize their fall risk. The purpose of this lesson is to teach patients about the importance of maintaining balance as they age and  ways to minimize their risk of falling.  WORKSHOPS   Nutrition:  Fueling a Ship broker led group instruction and group discussion with PowerPoint presentation and patient guidebook. To enhance the learning environment the use of posters, models and videos may be added. Patients will review the foundational principles of the Pritikin Eating Plan and understand what constitutes a serving size in each of the food groups. Patients will also learn Pritikin-friendly foods that are better choices when away from home and review make-ahead meal and snack options. Calorie density will be reviewed and applied to three nutrition priorities: weight maintenance, weight loss, and weight gain. The purpose of this lesson is to reinforce (in a group setting) the key concepts around what patients are recommended to eat and how to apply these guidelines when away from home by planning and selecting Pritikin-friendly options. Patients will understand how calorie density may be adjusted for different weight management goals.  Mindful Eating  Clinical staff led group instruction and group discussion with PowerPoint presentation and patient guidebook. To enhance the learning environment the use of posters, models and videos may be added. Patients will briefly review the concepts of the Pritikin Eating Plan and the importance of low-calorie dense foods. The concept of mindful eating will be introduced as well as the importance of paying attention to internal hunger signals. Triggers for non-hunger eating and techniques for dealing with triggers will be explored. The purpose of this lesson is to provide patients with the opportunity to review the basic principles of the Pritikin Eating Plan, discuss the value of  eating mindfully and how to measure internal cues of hunger and fullness using the Hunger Scale. Patients will also discuss reasons for non-hunger eating and learn strategies to use for controlling emotional eating.  Targeting Your Nutrition Priorities Clinical staff led group instruction and group discussion with PowerPoint presentation and patient guidebook. To enhance the learning environment the use of posters, models and videos may be added. Patients will learn how to determine their genetic susceptibility to disease by reviewing their family history. Patients will gain insight into the importance of diet as part of an overall healthy lifestyle in mitigating the impact of genetics and other environmental insults. The purpose of this lesson is to provide patients with the opportunity to assess their personal nutrition priorities by looking at their family history, their own health history and current risk factors. Patients will also be able to discuss ways of prioritizing and modifying the Pritikin Eating Plan for their highest risk areas  Menu  Clinical staff led group instruction and group discussion with PowerPoint presentation and patient guidebook. To enhance the learning environment the use of posters, models and videos may be added. Using menus brought in from [employer], or printed from [employer], patients will apply the Pritikin dining out guidelines that were presented in the [employer] video. Patients will also be able to practice these guidelines in a variety of provided scenarios. The purpose of this lesson is to provide patients with the opportunity to practice hands-on learning of the Pritikin Dining Out guidelines with actual menus and practice scenarios.  Label Reading Clinical staff led group instruction and group discussion with PowerPoint presentation and patient guidebook. To enhance the learning environment the use of posters, models and videos may be  added. Patients will review and discuss the Pritikin label reading guidelines presented in Pritikin's Label Reading Educational series video. Using fool labels brought in from local grocery stores and  markets, patients will apply the label reading guidelines and determine if the packaged food meet the Pritikin guidelines. The purpose of this lesson is to provide patients with the opportunity to review, discuss, and practice hands-on learning of the Pritikin Label Reading guidelines with actual packaged food labels. Cooking School  Pritikin's [employer] are designed to teach patients ways to prepare quick, simple, and affordable recipes at home. The importance of nutrition's role in chronic disease risk reduction is reflected in its emphasis in the overall Pritikin program. By learning how to prepare essential core Pritikin Eating Plan recipes, patients will increase control over what they eat; be able to customize the flavor of foods without the use of added salt, sugar, or fat; and improve the quality of the food they consume. By learning a set of core recipes which are easily assembled, quickly prepared, and affordable, patients are more likely to prepare more healthy foods at home. These workshops focus on convenient breakfasts, simple entres, side dishes, and desserts which can be prepared with minimal effort and are consistent with nutrition recommendations for cardiovascular risk reduction. Cooking [employer] are taught by a Armed forces logistics/support/administrative officer (RD) who has been trained by the [employer]. The chef or RD has a clear understanding of the importance of minimizing - if not completely eliminating - added fat, sugar, and sodium in recipes. Throughout the series of Cooking School Workshop sessions, patients will learn about healthy ingredients and efficient methods of cooking to build confidence in their capability to prepare    Cooking School weekly topics:  Adding  Flavor- Sodium-Free  Fast and Healthy Breakfasts  Powerhouse Plant-Based Proteins  Satisfying Salads and Dressings  Simple Sides and Sauces  International Cuisine-Spotlight on the [employer] Zones  Delicious Desserts  Savory Soups  [employer] - Meals in a Astronomer Appetizers and Snacks  Comforting Weekend Breakfasts  One-Pot Wonders   Fast Evening Meals  Landscape architect Your Pritikin Plate  WORKSHOPS   Healthy Mindset (Psychosocial):  Focused Goals, Sustainable Changes Clinical staff led group instruction and group discussion with PowerPoint presentation and patient guidebook. To enhance the learning environment the use of posters, models and videos may be added. Patients will be able to apply effective goal setting strategies to establish at least one personal goal, and then take consistent, meaningful action toward that goal. They will learn to identify common barriers to achieving personal goals and develop strategies to overcome them. Patients will also gain an understanding of how our mind-set can impact our ability to achieve goals and the importance of cultivating a positive and growth-oriented mind-set. The purpose of this lesson is to provide patients with a deeper understanding of how to set and achieve personal goals, as well as the tools and strategies needed to overcome common obstacles which may arise along the way.  From Head to Heart: The Power of a Healthy Outlook  Clinical staff led group instruction and group discussion with PowerPoint presentation and patient guidebook. To enhance the learning environment the use of posters, models and videos may be added. Patients will be able to recognize and describe the impact of emotions and mood on physical health. They will discover the importance of self-care and explore self-care practices which may work for them. Patients will also learn how to utilize the 4 C's to cultivate a healthier outlook and better  manage stress and challenges. The purpose of this lesson is to demonstrate to patients how a  healthy outlook is an essential part of maintaining good health, especially as they continue their cardiac rehab journey.  Healthy Sleep for a Healthy Heart Clinical staff led group instruction and group discussion with PowerPoint presentation and patient guidebook. To enhance the learning environment the use of posters, models and videos may be added. At the conclusion of this workshop, patients will be able to demonstrate knowledge of the importance of sleep to overall health, well-being, and quality of life. They will understand the symptoms of, and treatments for, common sleep disorders. Patients will also be able to identify daytime and nighttime behaviors which impact sleep, and they will be able to apply these tools to help manage sleep-related challenges. The purpose of this lesson is to provide patients with a general overview of sleep and outline the importance of quality sleep. Patients will learn about a few of the most common sleep disorders. Patients will also be introduced to the concept of "sleep hygiene," and discover ways to self-manage certain sleeping problems through simple daily behavior changes. Finally, the workshop will motivate patients by clarifying the links between quality sleep and their goals of heart-healthy living.   Recognizing and Reducing Stress Clinical staff led group instruction and group discussion with PowerPoint presentation and patient guidebook. To enhance the learning environment the use of posters, models and videos may be added. At the conclusion of this workshop, patients will be able to understand the types of stress reactions, differentiate between acute and chronic stress, and recognize the impact that chronic stress has on their health. They will also be able to apply different coping mechanisms, such as reframing negative self-talk. Patients will have the opportunity  to practice a variety of stress management techniques, such as deep abdominal breathing, progressive muscle relaxation, and/or guided imagery.  The purpose of this lesson is to educate patients on the role of stress in their lives and to provide healthy techniques for coping with it.  Learning Barriers/Preferences:  Learning Barriers/Preferences - 06/18/24 1520       Learning Barriers/Preferences   Learning Barriers None    Learning Preferences Audio;Computer/Internet;Individual Instruction;Group Instruction;Pictoral;Skilled Demonstration;Verbal Instruction;Video;Written Material          Education Topics:  Knowledge Questionnaire Score:  Knowledge Questionnaire Score - 06/18/24 1520       Knowledge Questionnaire Score   Pre Score 23/24          Core Components/Risk Factors/Patient Goals at Admission:  Personal Goals and Risk Factors at Admission - 06/18/24 1522       Core Components/Risk Factors/Patient Goals on Admission    Weight Management Yes;Weight Maintenance    Intervention Weight Management: Provide education and appropriate resources to help participant work on and attain dietary goals.;Weight Management: Develop a combined nutrition and exercise program designed to reach desired caloric intake, while maintaining appropriate intake of nutrient and fiber, sodium and fats, and appropriate energy expenditure required for the weight goal.    Expected Outcomes Short Term: Continue to assess and modify interventions until short term weight is achieved;Long Term: Adherence to nutrition and physical activity/exercise program aimed toward attainment of established weight goal;Weight Maintenance: Understanding of the daily nutrition guidelines, which includes 25-35% calories from fat, 7% or less cal from saturated fats, less than 200mg  cholesterol, less than 1.5gm of sodium, & 5 or more servings of fruits and vegetables daily;Understanding of distribution of calorie intake  throughout the day with the consumption of 4-5 meals/snacks;Understanding recommendations for meals to include 15-35% energy as protein, 25-35% energy  from fat, 35-60% energy from carbohydrates, less than 200mg  of dietary cholesterol, 20-35 gm of total fiber daily    Hypertension Yes    Intervention Provide education on lifestyle modifcations including regular physical activity/exercise, weight management, moderate sodium restriction and increased consumption of fresh fruit, vegetables, and low fat dairy, alcohol moderation, and smoking cessation.;Monitor prescription use compliance.    Expected Outcomes Short Term: Continued assessment and intervention until BP is < 140/84mm HG in hypertensive participants. < 130/62mm HG in hypertensive participants with diabetes, heart failure or chronic kidney disease.;Long Term: Maintenance of blood pressure at goal levels.    Lipids Yes    Intervention Provide education and support for participant on nutrition & aerobic/resistive exercise along with prescribed medications to achieve LDL 70mg , HDL >40mg .    Expected Outcomes Short Term: Participant states understanding of desired cholesterol values and is compliant with medications prescribed. Participant is following exercise prescription and nutrition guidelines.;Long Term: Cholesterol controlled with medications as prescribed, with individualized exercise RX and with personalized nutrition plan. Value goals: LDL < 70mg , HDL > 40 mg.          Core Components/Risk Factors/Patient Goals Review:   Goals and Risk Factor Review     Row Name 06/24/24 9167 07/04/24 0830 08/05/24 1653 09/03/24 1318       Core Components/Risk Factors/Patient Goals Review   Personal Goals Review Weight Management/Obesity;Hypertension;Lipids Weight Management/Obesity;Hypertension;Lipids Weight Management/Obesity;Hypertension;Lipids Weight Management/Obesity;Hypertension;Lipids    Review Okechukwu started cardiac rehab on 06/23/24. Merl  did well with exercise. Systolic BP's were noted to be in the upper 90's to low 100's. patient asymptomatic. Will continue to monitor  BP Yaw started cardiac rehab on 06/23/24. Halim is off to a good start with exercise. Vital signs have been stable. Yates has increased his met levels Enoch started cardiac rehab on 06/23/24 and is doing well with exercise at cardiac rehab. Vital signs have been stable. Kito has increased his met levels [place] is doing well with exercise at cardiac rehab. Vital signs remain stable. Holten has increased his met levels. Kevork is doing well with HIT training. Brain will tenatively complete cardiac rehab on 09/10/24    Expected Outcomes Pamela will continue to participate in cardiac rehab for exercise, nutrition and lifestyle modifications Junious will continue to participate in cardiac rehab for exercise, nutrition and lifestyle modifications Khristopher will continue to participate in cardiac rehab for exercise, nutrition and lifestyle modifications Jarious will continue to participate in cardiac rehab for exercise, nutrition and lifestyle modifications       Core Components/Risk Factors/Patient Goals at Discharge (Final Review):   Goals and Risk Factor Review - 09/03/24 1318       Core Components/Risk Factors/Patient Goals Review   Personal Goals Review Weight Management/Obesity;Hypertension;Lipids    Review Shareef is doing well with exercise at cardiac rehab. Vital signs remain stable. Kolby has increased his met levels. Daiton is doing well with HIT training. Brain will tenatively complete cardiac rehab on 09/10/24    Expected Outcomes Promise will continue to participate in cardiac rehab for exercise, nutrition and lifestyle modifications          ITP Comments:  ITP Comments     Row Name 06/18/24 1318 06/24/24 0819 07/04/24 0829 08/05/24 1648 09/03/24 1316   ITP Comments Dr. Wilbert Bihari medical director. Introduction to pritikin education/intensive cardiac rehab. Initial  orientation packet reviewed with patient. 30 Day ITP Review. Maurisio started cardiac rehab on 06/24/24. Raman did well with exercise. 30 Day ITP Review. Eliu started  cardiac rehab on 06/24/24. Shamarr is off to a good start  with exercise. 30 Day ITP Review. Iva started cardiac rehab on 06/24/24 and has good attendance and participation with exercise at cardiac rehab 30 Day ITP Review. Jared has good attendance and participation with exercise at cardiac rehab. Jaremy will tenatively complete cardiac rehab on 09/10/24      Comments: See ITP comments.[name] RN BSN

## 2024-09-05 ENCOUNTER — Encounter (HOSPITAL_COMMUNITY)
Admission: RE | Admit: 2024-09-05 | Discharge: 2024-09-05 | Disposition: A | Discharge status: Home / Self Care | Source: Ambulatory Visit | Attending: Cardiology | Admitting: Cardiology

## 2024-09-05 DIAGNOSIS — Z951 Presence of aortocoronary bypass graft: Secondary | ICD-10-CM | POA: Diagnosis not present

## 2024-09-07 NOTE — Progress Notes (Unsigned)
 Cardiology Office Note    Date:  09/09/2024  ID:  ESKER DEVER, DOB 1971-04-13, MRN [id number] PCP:  Jacques Camie Pepper, PA-C  Cardiologist:  [name], MD  Electrophysiologist:  None   Chief Complaint: Follow up for CAD   History of Present Illness: .    Gary Castaneda is a 53 y.o. male with visit-pertinent history of CAD s/p CABG x 4 with LIMA to LAD, RSVG to PDA to OM, left radial RI, hypertension, vasovagal syncope, hyperlipidemia.  Patient underwent left heart catheterization on 04/08/2024 that revealed three-vessel CAD with multivessel lesions in the RCA, 70% ostial LAD, 90% RI and 90% proximal RCA.  Patient was seen by Dr. Shyrl on 04/18/2024 with recommendation to undergo bypass which was completed on 04/24/2024.  He had CABG x 4 utilizing LIMA to LAD, RSVG to PDA to OM and left radial to RI.  Postprocedure hospital stay was uneventful, maintained sinus rhythm.  Postop 2D echo indicated EF 60 to 65%.  Patient was discharged on 04/28/2024.  Patient was seen in clinic on 05/15/2024 by Jackee Alberts, NP he was doing well from a cardiac standpoint.  Today he presents for follow-up.  He reports that he has been doing well overall.  He denies any chest pain, shortness of breath, lower extremity edema, orthopnea or PND.  He denies any palpitations, presyncope or syncope.  Patient notes that he graduated from cardiac rehab yesterday, enjoyed his time there and tolerated very well.  He reports that he is exercising typically 5-6 times a week and is starting back with lifting lighter weights.  Patient denies any cardiac concerns or complaints today. ROS: .   Today he denies chest pain, shortness of breath, lower extremity edema, fatigue, palpitations, melena, hematuria, hemoptysis, diaphoresis, weakness, presyncope, syncope, orthopnea, and PND.  All other systems are reviewed and otherwise negative. Studies Reviewed: SABRA   EKG:  EKG is not ordered today.  CV Studies: Cardiac studies  reviewed are outlined and summarized above. Otherwise please see EMR for full report. Cardiac Studies & Procedures   ______________________________________________________________________________________________ CARDIAC CATHETERIZATION  CARDIAC CATHETERIZATION 04/08/2024  Conclusion Images from the original result were not included.    Prox RCA lesion is 40% stenosed. Mid RCA lesion is 70% stenosed. Dist RCA-2 lesion is 70% stenosed. Dist RCA-1 lesion is 70% stenosed.   Ost LAD to Prox LAD lesion is 70% stenosed. Prox LAD to Mid LAD lesion is 10% stenosed.  After the segmental 10% stenosis, the RFR was highly positive at 0.84.   Ramus lesion is 90% stenosed.   Prox Cx to Mid Cx lesion is 85% stenosed.   --------------------------------------------   The left ventricular systolic function is normal.  The left ventricular ejection fraction is 55-65% by visual estimate.  LV end diastolic pressure is normal.   There is no aortic valve stenosis.  Diagnostic: Dominance: Right  POST-OPERATIVE DIAGNOSIS: Severe multivessel CAD: Multiple lesions throughout the RCA that was known to be positive by FFRCT: Proximal to mid 40%, mid 70% focal eccentric and 70% discrete focal distal RCA with 70% stenosis just prior to the PAV takeoff prior to the PDA. Left Main normal: Ostial LAD 70% stenosis with proximal FFR/RFR borderline however with the wire halfway down the artery after the next bend RFR is positive to 0.83.  The entire proximal vessel is calcified Moderate caliber RI with 90% eccentric calcified lesion but otherwise normal distal target Proximal RCA eccentric 90% stenosis.  The vessel then gives off a small  OM 1 before bifurcating into OM 2 and OM 3 Normal LVEF (55 to 65%) and normal EDP  PLAN OF CARE: Discharge to home after PACU => patient is already on optimal medical management.  Will discussed with the patient in heart team meeting later this week and tentatively referred to CVTS for  CABG.    Alm Clay, MD  Findings Coronary Findings Diagnostic  Dominance: Right  Left Main Vessel was injected. Vessel is normal in caliber. Vessel is angiographically normal.  Left Anterior Descending Vessel was injected. Vessel is normal in caliber. Vessel is angiographically normal. Ost LAD to Prox LAD lesion is 70% stenosed. The lesion is located at the bifurcation, discrete and concentric. Pressure gradient was performed on the lesion using a GUIDEWIRE PRESSURE X 175 and CATH VISTA GUIDE 6FR XB3.5 EPK. FFR: 84. RFR: 0.91. Prox LAD to Mid LAD lesion is 10% stenosed. The lesion is eccentric. The lesion is calcified. Pressure gradient was performed on the lesion using a GUIDEWIRE PRESSURE X 175 and CATH VISTA GUIDE 6FR XB3.5 EPK. RFR: 0.83.  First Diagonal Branch Vessel is small in size.  Ramus Intermedius Vessel is large. Ramus lesion is 90% stenosed.  Left Circumflex Vessel was injected. Vessel is normal in caliber. Vessel is angiographically normal. Prox Cx to Mid Cx lesion is 85% stenosed. By [facility], this vessel was not able to be modeled due to severity of disease  First Obtuse Marginal Branch Vessel is small in size.  Right Coronary Artery Vessel was injected. Vessel is normal in caliber. Vessel is angiographically normal. Prox RCA lesion is 40% stenosed. Mid RCA lesion is 70% stenosed. Dist RCA-1 lesion is 70% stenosed. The lesion is type B1 and discrete. Almost LineaR Dist RCA-2 lesion is 70% stenosed.  Right Ventricular Branch Vessel is small in size.  Intervention  No interventions have been documented.   STRESS TESTS  MYOCARDIAL PERFUSION IMAGING 08/01/2018  Interpretation Summary  Nuclear stress EF: 71%.  Blood pressure demonstrated a blunted response to exercise.  There was no ST segment deviation noted during stress.  The study is normal.  The left ventricular ejection fraction is hyperdynamic (>65%).  1. Excellent exercise tolerance but  blunted BP response noted (of uncertain significance). 2. No ischemic ST changes on exercise ECG. 3. EF 71%, normal wall motion. 4. No evidence for ischemia or infarction on perfusion images.  Normal study.   ECHOCARDIOGRAM  ECHOCARDIOGRAM COMPLETE 04/09/2024  Narrative ECHOCARDIOGRAM REPORT    Patient Name:   EARNIE BECHARD Date of Exam: 04/09/2024 Medical Rec #:  [id number]        Height:       66.0 in Accession #:    [id number]       Weight:       150.0 lb Date of Birth:  [birth year]        BSA:          1.770 m Patient Age:    52 years         BP:           148/84 mmHg Patient Gender: M                HR:           67 bpm. Exam Location:  [address]  Procedure: 2D Echo, 3D Echo, Cardiac Doppler and Color Doppler (Both Spectral and Color Flow Doppler were utilized during procedure).  Indications:    R07.9 Chest Pain  History:  Patient has no prior history of Echocardiogram examinations. CAD, Signs/Symptoms:Chest Pain; Risk Factors:Family History of Coronary Artery Disease, Hypertension and Dyslipidemia. Elevated Coronary Calcium  Score. Cath Performed Yesterday, with Coronary Artery Disease, Pre-Operative Eval for Possible CABG.  Sonographer:    Heather Hawks RDCS Referring Phys: CAMIE CARTER [name]  IMPRESSIONS   1. Left ventricular ejection fraction, by estimation, is 60 to 65%. Left ventricular ejection fraction by 3D volume is 65 %. The left ventricle has normal function. The left ventricle has no regional wall motion abnormalities. Left ventricular diastolic parameters were normal. 2. Right ventricular systolic function is normal. The right ventricular size is normal. 3. The mitral valve is normal in structure. Trivial mitral valve regurgitation. No evidence of mitral stenosis. 4. The aortic valve is tricuspid. Aortic valve regurgitation is not visualized. No aortic stenosis is present. 5. The inferior vena cava is normal in size with greater than 50%  respiratory variability, suggesting right atrial pressure of 3 mmHg.  Conclusion(s)/Recommendation(s): Normal biventricular function without evidence of hemodynamically significant valvular heart disease.  FINDINGS Left Ventricle: Left ventricular ejection fraction, by estimation, is 60 to 65%. Left ventricular ejection fraction by 3D volume is 65 %. The left ventricle has normal function. The left ventricle has no regional wall motion abnormalities. The left ventricular internal cavity size was normal in size. There is no left ventricular hypertrophy. Left ventricular diastolic parameters were normal.  Right Ventricle: The right ventricular size is normal. No increase in right ventricular wall thickness. Right ventricular systolic function is normal.  Left Atrium: Left atrial size was normal in size.  Right Atrium: Right atrial size was normal in size.  Pericardium: There is no evidence of pericardial effusion.  Mitral Valve: The mitral valve is normal in structure. Trivial mitral valve regurgitation. No evidence of mitral valve stenosis.  Tricuspid Valve: The tricuspid valve is normal in structure. Tricuspid valve regurgitation is trivial. No evidence of tricuspid stenosis.  Aortic Valve: The aortic valve is tricuspid. Aortic valve regurgitation is not visualized. No aortic stenosis is present.  Pulmonic Valve: The pulmonic valve was normal in structure. Pulmonic valve regurgitation is trivial. No evidence of pulmonic stenosis.  Aorta: The aortic root is normal in size and structure.  Venous: The inferior vena cava is normal in size with greater than 50% respiratory variability, suggesting right atrial pressure of 3 mmHg.  IAS/Shunts: No atrial level shunt detected by color flow Doppler.  Additional Comments: 3D was performed not requiring image post processing on an independent workstation and was normal.   LEFT VENTRICLE PLAX 2D LVIDd:         4.00 cm         Diastology LVIDs:          2.75 cm         LV e' medial:    11.10 cm/s LV PW:         0.80 cm         LV E/e' medial:  7.8 LV IVS:        0.75 cm         LV e' lateral:   14.90 cm/s LVOT diam:     1.95 cm         LV E/e' lateral: 5.8 LV SV:         59 LV SV Index:   33 LVOT Area:     2.99 cm        3D Volume EF LV 3D EF:    Left  ventricul ar ejection fraction by 3D volume is 65 %.  3D Volume EF: 3D EF:        65 % LV EDV:       111 ml LV ESV:       39 ml LV SV:        73 ml  RIGHT VENTRICLE RV Basal diam:  3.20 cm RV S prime:     13.40 cm/s  LEFT ATRIUM           Index        RIGHT ATRIUM           Index LA diam:      3.45 cm 1.95 cm/m   RA Pressure: 3.00 mmHg LA Vol (A2C): 42.3 ml 23.90 ml/m  RA Area:     11.30 cm LA Vol (A4C): 35.7 ml 20.17 ml/m  RA Volume:   26.00 ml  14.69 ml/m AORTIC VALVE LVOT Vmax:   93.85 cm/s LVOT Vmean:  61.300 cm/s LVOT VTI:    0.196 m  AORTA Ao Root diam: 2.80 cm Ao Asc diam:  3.40 cm  MITRAL VALVE               TRICUSPID VALVE MV Area (PHT)  cm         Estimated RAP:  3.00 mmHg MV Decel Time: 170 msec MV E velocity: 86.80 cm/s  SHUNTS MV A velocity: 81.20 cm/s  Systemic VTI:  0.20 m MV E/A ratio:  1.07        Systemic Diam: 1.95 cm  [name] MD Electronically signed by [name] MD Signature Date/Time: 04/09/2024/4:14:43 PM    Final      CT SCANS  CT CORONARY MORPH W/CTA COR W/SCORE 03/28/2024  Addendum 04/12/2024  6:09 PM ADDENDUM REPORT: 04/12/2024 18:07  EXAM: OVER-READ INTERPRETATION  CT CHEST  The following report is an over-read performed by radiologist Dr. Fonda Mom [facility] on 04/12/2024. This over-read does not include interpretation of cardiac or coronary anatomy or pathology. The coronary CTA interpretation by the cardiologist is attached.  COMPARISON:  None.  FINDINGS: Cardiovascular:  See findings discussed in the body of the report.  Mediastinum/Nodes: No suspicious adenopathy  identified. Imaged mediastinal structures are unremarkable.  Lungs/Pleura: Imaged lungs are clear. No pleural effusion or pneumothorax.  Upper Abdomen: No acute abnormality.  Musculoskeletal: Bilateral gynecomastia. No acute osseous findings. There are thoracic degenerative changes.  IMPRESSION: No acute extracardiac abnormality.   Electronically Signed By: Fonda Field M.D. On: 04/12/2024 18:07  Narrative CLINICAL DATA:  High calcium  score  EXAM: Cardiac/Coronary CTA  TECHNIQUE: A non-contrast, gated CT scan was obtained with axial slices of 2.5 mm through the heart for calcium  scoring. Calcium  scoring was performed using the Agatston method. A 120 kV prospective, gated, contrast cardiac CT scan was obtained. Gantry rotation speed was 230 msec and collimation was 0.63 mm. Two sublingual nitroglycerin  tablets (0.8 mg) were given. The 3D data set was reconstructed with motion correction for the best systolic or diastolic phase. Images were analyzed on a dedicated workstation using MPR, MIP, and VRT modes. The patient received 95 cc of contrast.  FINDINGS: Image quality: Fair  Noise artifact is: Limited.  Coronary Arteries:  Normal coronary origin.  Right dominance.  Left main: The left main is a large caliber vessel with a normal take off from the left coronary cusp that bifurcates to form a left anterior descending artery and a left circumflex artery. trifurcates into a LAD,  LCX, and ramus intermedius. Calcified plaque in left main causes 0-24% stenosis  Left anterior descending artery: The LAD is patent. The LAD gives off 2 patent diagonal branches. Calcified plaque in proximal LAD causes 50-69% stenosis. Calcified plaque in mid to distal LAD causes 25-49% stenosis.  Ramus intermedius: Patent. Calcified plaque in ramus causes 50-69% stenosis  Left circumflex artery: The LCX is non-dominant and patent. Calcified plaque in proximal to mid LCX causes 25-49%  stenosis.  Right coronary artery: The RCA is dominant with normal take off from the right coronary cusp. The RCA terminates as a PDA and right posterolateral branch. Calcified plaque in proximal to mid RCA causes 50-69% stenosis. Calcified plaque in distal RCA causes 25-49% stenosis  Right Atrium: Right atrial size is mildly enlarged  Right Ventricle: The right ventricular cavity is within normal limits.  Left Atrium: Left atrial size is normal in size with no left atrial appendage filling defect.  Left Ventricle: The ventricular cavity size is within normal limits.  Pulmonary arteries: Normal in size.  Pulmonary veins: Normal pulmonary venous drainage.  Pericardium: Normal thickness without significant effusion or calcium  present.  Cardiac valves: The aortic valve is trileaflet without significant calcification. The mitral valve is normal without significant calcification.  Aorta: Normal caliber without significant disease.  Extra-cardiac findings: See attached radiology report for non-cardiac structures.  IMPRESSION: 1. Coronary calcium  score of 2855. This was 99th percentile for age-, sex, and race-matched controls.  2. Total plaque volume 1441mm3 which is 100th percentile for age- and sex-matched controls (calcified plaque 477mm3; non-calcified plaque 1045mm3). TPV is extensive  3.  Normal coronary origin with right dominance.  4. Difficult to assess degree of stenosis due to severe multivessel coronary calcifications  5. Moderate (50-69%) in proximal LAD, proximal ramus, and proximal to mid RCA  6. Mild (25-49%) stenosis in mid to distal LAD, proximal to mid LCX, and distal RCA 7.  Minimal (0-24%) stenosis in left main  8.  Will send study for CTFFR  RECOMMENDATIONS: CAD-RADS 3: Moderate stenosis. Consider symptom-guided anti-ischemic pharmacotherapy as well as risk factor modification per guideline directed care. Additional analysis with CT FFR will be  submitted.  Electronically Signed: By: Lonni Nanas M.D. On: 03/31/2024 12:34   CT SCANS  CT CARDIAC SCORING (SELF PAY ONLY) 07/19/2018  Addendum 07/21/2018  1:08 PM ADDENDUM REPORT: 07/21/2018 13:05  CLINICAL DATA:  Risk stratification  EXAM: Coronary Calcium  Score  TECHNIQUE: The patient was scanned on a Siemens Somatom 64 slice scanner. Axial non-contrast 3 mm slices were carried out through the heart. The data set was analyzed on a dedicated work station and scored using the Agatson method.  FINDINGS: Non-cardiac: See separate report from [facility].  Ascending Aorta: Normal diameter 3.3 cm  Pericardium: Normal  Coronary arteries: Marked 3 vessel coronary calcification  IMPRESSION: Coronary calcium  score of 1130 . This was 13 th percentile for age and sex matched control.  Consider f/u perfusion study given how high score is  Maude Emmer   Electronically Signed By: Maude Emmer M.D. On: 07/21/2018 13:05  Narrative EXAM: OVER-READ INTERPRETATION  CT CHEST  The following report is an over-read performed by radiologist Dr. Toribio Aye of [facility] on 07/19/2018. This over-read does not include interpretation of cardiac or coronary anatomy or pathology. The coronary calcium  score/coronary CTA interpretation by the cardiologist is attached.  COMPARISON:  None.  FINDINGS: Aortic atherosclerosis. Within the visualized portions of the thorax there are no suspicious appearing pulmonary nodules  or masses, there is no acute consolidative airspace disease, no pleural effusions, no pneumothorax and no lymphadenopathy. Visualized portions of the upper abdomen are unremarkable. There are no aggressive appearing lytic or blastic lesions noted in the visualized portions of the skeleton.  IMPRESSION: Aortic Atherosclerosis (ICD10-I70.0).  Electronically Signed: By: Toribio Aye M.D. On: 07/19/2018 16:53      ______________________________________________________________________________________________       Current Reported Medications:.    Current Meds  Medication Sig   amLODipine  (NORVASC ) 5 MG tablet TAKE 1 TABLET (5MG ) BY MOUTH DAILY.   Ascorbic Acid (VITAMIN C PO) Take 1 tablet by mouth daily.   aspirin  EC (QC ENTERIC ASPIRIN ) 325 MG tablet TAKE 1 TABLET (325MG ) BY MOUTH DAILY.   Cholecalciferol (D3 PO) Take 1 tablet by mouth daily.   ezetimibe  (ZETIA ) 10 MG tablet Take 10 mg by mouth daily.   Ferrous Sulfate (IRON PO) Take 1 tablet by mouth daily.   Menaquinone-7 (K2 PO) Take 1 tablet by mouth daily.   metoprolol  tartrate (LOPRESSOR ) 25 MG tablet Take 0.5 tablets (12.5 mg total) by mouth 2 (two) times daily.   REPATHA  SURECLICK 140 MG/ML SOAJ Inject 140 mg into the skin every 14 (fourteen) days.   rosuvastatin  (CRESTOR ) 20 MG tablet TAKE (1) TABLET DAILY AT BEDTIME.   Physical Exam:    VS:  BP 122/78   Pulse 73   Ht 5' 6 (1.676 m)   Wt 144 lb (65.3 kg)   SpO2 99%   BMI 23.24 kg/m    Wt Readings from Last 3 Encounters:  09/09/24 144 lb (65.3 kg)  06/18/24 145 lb 4.5 oz (65.9 kg)  06/03/24 146 lb 14.4 oz (66.6 kg)    GEN: Well nourished, well developed in no acute distress NECK: No JVD; No carotid bruits CARDIAC: RRR, no murmurs, rubs, gallops RESPIRATORY:  Clear to auscultation without rales, wheezing or rhonchi  ABDOMEN: Soft, non-tender, non-distended EXTREMITIES:  No edema; No acute deformity     Asessement and Plan:.    History of CAD: s/p CABG x4 with LIMA to LAD, RSVG-PDA-OM, Lradial-RI. Stable with no anginal symptoms. No indication for ischemic evaluation.  Heart healthy diet and regular cardiovascular exercise encouraged.  Patient has graduated from cardiac rehab, plans to continue exercising, tolerating very well.  Reviewed ED precautions.  Will reach out to Dr. Jeffrie regarding when patient can start aspirin  81 mg daily.  Continue amlodipine  5 mg daily,  Repatha , Zetia  10 mg daily, metoprolol  tartrate 12.5 mg twice daily and Crestor  40 mg daily.  HTN: Blood pressure today 136/80, on recheck was 122/78.  Continue current antihypertensive regimen.  HLD: Last lipid profile on 10/17/2023 indicated total cholesterol 117, triglycerides 105, HDL 58 and LDL 40.  Patient currently on Repatha , Crestor  40 mg daily and Zetia  10 mg daily.  Patient planning to follow-up with his PCP for his annual physical with fasting lab work.   Disposition: F/u with Dr. Jeffrie in six months or sooner if needed.   Signed, Naliya Gish D Payton Moder, NP

## 2024-09-08 ENCOUNTER — Encounter (HOSPITAL_COMMUNITY)
Admission: RE | Admit: 2024-09-08 | Discharge: 2024-09-08 | Disposition: A | Source: Ambulatory Visit | Attending: Cardiology

## 2024-09-08 DIAGNOSIS — Z951 Presence of aortocoronary bypass graft: Secondary | ICD-10-CM

## 2024-09-09 ENCOUNTER — Ambulatory Visit: Attending: Cardiology | Admitting: Cardiology

## 2024-09-09 ENCOUNTER — Encounter: Payer: Self-pay | Admitting: Cardiology

## 2024-09-09 VITALS — BP 122/78 | HR 73 | Ht 66.0 in | Wt 144.0 lb

## 2024-09-09 DIAGNOSIS — I10 Essential (primary) hypertension: Secondary | ICD-10-CM | POA: Diagnosis not present

## 2024-09-09 DIAGNOSIS — Z951 Presence of aortocoronary bypass graft: Secondary | ICD-10-CM

## 2024-09-09 DIAGNOSIS — I2584 Coronary atherosclerosis due to calcified coronary lesion: Secondary | ICD-10-CM

## 2024-09-09 DIAGNOSIS — I2510 Atherosclerotic heart disease of native coronary artery without angina pectoris: Secondary | ICD-10-CM | POA: Diagnosis not present

## 2024-09-09 DIAGNOSIS — E782 Mixed hyperlipidemia: Secondary | ICD-10-CM | POA: Diagnosis not present

## 2024-09-09 NOTE — Patient Instructions (Signed)
 Medication Instructions:  Your physician recommends that you continue on your current medications as directed. Please refer to the Current Medication list given to you today.  *If you need a refill on your cardiac medications before your next appointment, please call your pharmacy*  Lab Work: None ordered If you have labs (blood work) drawn today and your tests are completely normal, you will receive your results only by: MyChart Message (if you have MyChart) OR A paper copy in the mail If you have any lab test that is abnormal or we need to change your treatment, we will call you to review the results.  Testing/Procedures: None ordered  Follow-Up: At [facility], you and your health needs are our priority.  As part of our continuing mission to provide you with exceptional heart care, our providers are all part of one team.  This team includes your primary Cardiologist (physician) and Advanced Practice Providers or APPs (Physician Assistants and Nurse Practitioners) who all work together to provide you with the care you need, when you need it.  Your next appointment:   6 month(s)  Provider:   [name], MD

## 2024-09-10 ENCOUNTER — Encounter (HOSPITAL_COMMUNITY)

## 2024-09-10 NOTE — Progress Notes (Signed)
 Pt is due for OV and fasting labs Left a VM to remind him to schedule Reviewed cards notes

## 2024-09-11 ENCOUNTER — Ambulatory Visit: Admitting: Emergency Medicine

## 2024-09-18 NOTE — Progress Notes (Signed)
 Discharge Progress Report  Patient Details  Name: Gary Castaneda MRN: [id number] Date of Birth: May 01, 1971 Referring Provider:   Flowsheet Row INTENSIVE CARDIAC REHAB ORIENT from 06/18/2024 in [facility]  Referring Provider [name], MD     Number of Visits: 55  Reason for Discharge:  Patient reached a stable level of exercise. Patient independent in their exercise. Patient has met program and personal goals.  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  ADL [facility]SD:   Initial Exercise Prescription:  Initial Exercise Prescription - 06/18/24 1500       Date of Initial Exercise RX and Referring Provider   Date 06/18/24    Referring Provider [name], MD    Expected Discharge Date 09/10/24      Treadmill   MPH 3    Grade 0    Minutes 15    METs 2.5      Recumbant Elliptical   Level 1    RPM 60    Watts 70    Minutes 15    METs 2.5      Prescription Details   Frequency (times per week) 3    Duration Progress to 30 minutes of continuous aerobic without signs/symptoms of physical distress      Intensity   THRR 40-80% of Max Heartrate 68-136    Ratings of Perceived Exertion 11-13    Perceived Dyspnea 0-4      Progression   Progression Continue progressive overload as per policy without signs/symptoms or physical distress.      Resistance Training   Training Prescription Yes    Weight 3    Reps 10-15          Discharge Exercise Prescription (Final Exercise Prescription Changes):  Exercise Prescription Changes - 09/08/24 1630       Response to Exercise   Blood Pressure (Admit) 114/78    Blood Pressure (Exit) 116/66    Heart Rate (Admit) 79 bpm    Heart Rate (Exercise) 131 bpm    Heart Rate (Exit) 88 bpm    Rating of Perceived Exertion (Exercise) 6    Perceived Dyspnea (Exercise) 0    Symptoms  None    Comments Pt graduated teh [facility] ICR program    Duration Progress to 30 minutes of  aerobic without signs/symptoms of physical distress    Intensity THRR unchanged      Progression   Progression Continue to progress workloads to maintain intensity without signs/symptoms of physical distress.    Average METs 6.3      Resistance Training   Training Prescription Yes    Weight 8    Reps 10-15    Time 10 Minutes      Interval Training   Interval Training Yes    Equipment Treadmill    Comments 5 min jog, 5 min walk, 5 min jog      Treadmill   MPH 3.5   Jog 4.2 speed, 7.43 MET's   Grade 0    Minutes 15    METs 3.68      Elliptical   Level 6    Speed 6    Minutes 15    METs 7.8      Home Exercise Plan   Plans to continue exercise at [facility] (comment)    Frequency Add 4 additional  days to program exercise sessions.    Initial Home Exercises Provided 07/14/24          Functional Capacity:  6 Minute Walk     Row Name 06/18/24 1517 09/03/24 1630 09/11/24 0835     6 Minute Walk   Phase Initial Discharge --   Distance 1340 feet 2040 feet --   Distance % Change -- 52.24 % --   Distance Feet Change -- 700 ft --   Walk Time 6 minutes 6 minutes --   # of Rest Breaks 0 0 --   MPH 2.54 3.86 --   METS 1.06 5.5 --   RPE 9.5 6 --   Perceived Dyspnea  0 0 --   VO2 Peak 14.21 19.24 --   Symptoms No No --   Resting HR 68 bpm 67 bpm --   Resting BP 104/68 100/72 --   Resting Oxygen Saturation  98 % -- --   Exercise Oxygen Saturation  during 6 min walk 98 % -- --   Max Ex. HR 79 bpm 111 bpm --   Max Ex. BP 130/62 120/64 --   2 Minute Post BP 100/72 -- --      Psychological, QOL, Others - Outcomes: PHQ 2/9:    06/18/2024    3:17 PM  Depression screen PHQ 2/9  Decreased Interest 0  Down, Depressed, Hopeless 0  PHQ - 2 Score 0  Altered sleeping 0  Tired, decreased energy 0  Change in appetite 0  Feeling bad or failure about yourself  0  Trouble  concentrating 0  Moving slowly or fidgety/restless 0  Suicidal thoughts 0  PHQ-9 Score 0    Quality of Life:  Quality of Life - 09/12/24 0856       Quality of Life   Select Quality of Life      Quality of Life Scores   Health/Function Post 21.47 %    Socioeconomic Post 25.71 %    Psych/Spiritual Post 22.79 %    Family Post 27.6 %    GLOBAL Post 23.57 %          Personal Goals: Goals established at orientation with interventions provided to work toward goal.  Personal Goals and Risk Factors at Admission - 06/18/24 1522       Core Components/Risk Factors/Patient Goals on Admission    Weight Management Yes;Weight Maintenance    Intervention Weight Management: Provide education and appropriate resources to help participant work on and attain dietary goals.;Weight Management: Develop a combined nutrition and exercise program designed to reach desired caloric intake, while maintaining appropriate intake of nutrient and fiber, sodium and fats, and appropriate energy expenditure required for the weight goal.    Expected Outcomes Short Term: Continue to assess and modify interventions until short term weight is achieved;Long Term: Adherence to nutrition and physical activity/exercise program aimed toward attainment of established weight goal;Weight Maintenance: Understanding of the daily nutrition guidelines, which includes 25-35% calories from fat, 7% or less cal from saturated fats, less than 200mg  cholesterol, less than 1.5gm of sodium, & 5 or more servings of fruits and vegetables daily;Understanding of distribution of calorie intake throughout the day with the consumption of 4-5 meals/snacks;Understanding recommendations for meals to include 15-35% energy as protein, 25-35% energy from fat, 35-60% energy from carbohydrates, less than 200mg  of dietary cholesterol, 20-35 gm of total fiber daily    Hypertension Yes    Intervention Provide education on lifestyle modifcations including  regular physical activity/exercise, weight management,  moderate sodium restriction and increased consumption of fresh fruit, vegetables, and low fat dairy, alcohol moderation, and smoking cessation.;Monitor prescription use compliance.    Expected Outcomes Short Term: Continued assessment and intervention until BP is < 140/60mm HG in hypertensive participants. < 130/28mm HG in hypertensive participants with diabetes, heart failure or chronic kidney disease.;Long Term: Maintenance of blood pressure at goal levels.    Lipids Yes    Intervention Provide education and support for participant on nutrition & aerobic/resistive exercise along with prescribed medications to achieve LDL 70mg , HDL >40mg .    Expected Outcomes Short Term: Participant states understanding of desired cholesterol values and is compliant with medications prescribed. Participant is following exercise prescription and nutrition guidelines.;Long Term: Cholesterol controlled with medications as prescribed, with individualized exercise RX and with personalized nutrition plan. Value goals: LDL < 70mg , HDL > 40 mg.           Personal Goals Discharge:  Goals and Risk Factor Review     Row Name 06/24/24 9167 07/04/24 0830 08/05/24 1653 09/03/24 1318       Core Components/Risk Factors/Patient Goals Review   Personal Goals Review Weight Management/Obesity;Hypertension;Lipids Weight Management/Obesity;Hypertension;Lipids Weight Management/Obesity;Hypertension;Lipids Weight Management/Obesity;Hypertension;Lipids    Review Nell started cardiac rehab on 06/23/24. Lean did well with exercise. Systolic BP's were noted to be in the upper 90's to low 100's. patient asymptomatic. Will continue to monitor  BP Chief started cardiac rehab on 06/23/24. Cailen is off to a good start with exercise. Vital signs have been stable. Paiton has increased his met levels Chamberlain started cardiac rehab on 06/23/24 and is doing well with exercise at cardiac rehab.  Vital signs have been stable. Arvon has increased his met levels Lamaj is doing well with exercise at cardiac rehab. Vital signs remain stable. Twain has increased his met levels. Brixon is doing well with HIT training. Brain will tenatively complete cardiac rehab on 09/10/24    Expected Outcomes Trevonn will continue to participate in cardiac rehab for exercise, nutrition and lifestyle modifications Germain will continue to participate in cardiac rehab for exercise, nutrition and lifestyle modifications Jonerik will continue to participate in cardiac rehab for exercise, nutrition and lifestyle modifications Garreth will continue to participate in cardiac rehab for exercise, nutrition and lifestyle modifications       Exercise Goals and Review:  Exercise Goals     Row Name 06/18/24 1519             Exercise Goals   Increase Physical Activity Yes       Intervention Provide advice, education, support and counseling about physical activity/exercise needs.;Develop an individualized exercise prescription for aerobic and resistive training based on initial evaluation findings, risk stratification, comorbidities and participant's personal goals.       Expected Outcomes Long Term: Add in home exercise to make exercise part of routine and to increase amount of physical activity.;Long Term: Exercising regularly at least 3-5 days a week.;Short Term: Attend rehab on a regular basis to increase amount of physical activity.       Increase Strength and Stamina Yes       Intervention Develop an individualized exercise prescription for aerobic and resistive training based on initial evaluation findings, risk stratification, comorbidities and participant's personal goals.;Provide advice, education, support and counseling about physical activity/exercise needs.       Expected Outcomes Short Term: Increase workloads from initial exercise prescription for resistance, speed, and METs.;Long Term: Improve cardiorespiratory  fitness, muscular endurance and strength as measured by increased METs  and functional capacity ( );Short Term: Perform resistance training exercises routinely during rehab and add in resistance training at home       Able to understand and use rate of perceived exertion (RPE) scale Yes       Intervention Provide education and explanation on how to use RPE scale       Expected Outcomes Short Term: Able to use RPE daily in rehab to express subjective intensity level;Long Term:  Able to use RPE to guide intensity level when exercising independently       Knowledge and understanding of Target Heart Rate Range (THRR) Yes       Intervention Provide education and explanation of THRR including how the numbers were predicted and where they are located for reference       Expected Outcomes Long Term: Able to use THRR to govern intensity when exercising independently;Short Term: Able to state/look up THRR;Short Term: Able to use daily as guideline for intensity in rehab       Understanding of Exercise Prescription Yes       Intervention Provide education, explanation, and written materials on patient's individual exercise prescription       Expected Outcomes Short Term: Able to explain program exercise prescription;Long Term: Able to explain home exercise prescription to exercise independently          Exercise Goals Re-Evaluation:  Exercise Goals Re-Evaluation     Row Name 06/23/24 1649 07/14/24 1709 07/30/24 1630 09/08/24 1630       Exercise Goal Re-Evaluation   Exercise Goals Review Increase Physical Activity;Understanding of Exercise Prescription;Increase Strength and Stamina;Knowledge and understanding of Target Heart Rate Range (THRR);Able to understand and use rate of perceived exertion (RPE) scale Increase Physical Activity;Understanding of Exercise Prescription;Increase Strength and Stamina;Knowledge and understanding of Target Heart Rate Range (THRR);Able to understand and use rate of perceived  exertion (RPE) scale Increase Physical Activity;Understanding of Exercise Prescription;Increase Strength and Stamina;Knowledge and understanding of Target Heart Rate Range (THRR);Able to understand and use rate of perceived exertion (RPE) scale Increase Physical Activity;Understanding of Exercise Prescription;Increase Strength and Stamina;Knowledge and understanding of Target Heart Rate Range (THRR);Able to understand and use rate of perceived exertion (RPE) scale    Comments Pt first day in the [facility] I[facility] program. Pt tolerated exercise well with an average MEt level of 3.15. Pt is off to a good start and is learning his THRR, RPE and ExRx` Reviewed MET's, goals and home ExRx. Pt tolerated exercise well with an average MEt level of 4.43. He has been enjoying exercise, but feels he would like more of a challenge. BP's and HR's have been within limits. Will request jogging. He's sternal precautions lift 8/29 and we will review a gradual weight lifting plan then. So far he feels good with his goals and has an increase in strength and stamina. He plans to exercise on his own at [facility], walking and light hand weight for 30-45 mins 3-4 days Reviewed MET's and goals. Pt tolerated exercise well with an average MET level of 5.8. Pt is doing well and increasing WL's and MET's. He's doing well with his HIIT training. Reviewed gradual weight lifting progressions since his sternal precautions lifted Pt graduated the [employer] program. Pt tolerated exercise well with an average MET level of 6.3. Pt did very well in the program and achieved many goals, including jogging. He increased on his post walk test by 700 for for a total of 2074ft. He will continue to exercise by walk/jogging, [employer], light hand weights and maybe [employer] for 5-7 days for 30-60 mins    Expected Outcomes Will continue to monitor pt anf progress workloads as tolerated without sign or symptom Will continue to monitor pt anf progress  workloads as tolerated without sign or symptom Will continue to monitor pt anf progress workloads as tolerated without sign or symptom Will continue to monitor pt anf progress workloads as tolerated without sign or symptom       Nutrition & Weight - Outcomes:  Pre Biometrics - 06/18/24 1508       Pre Biometrics   Waist Circumference 35 inches    Hip Circumference 36 inches    Waist to Hip Ratio 0.97 %    Triceps Skinfold 12 mm    % Body Fat 22.3 %    Grip Strength 24 kg    Flexibility 12.25 in    Single Leg Stand 9.52 seconds          Post Biometrics - 09/03/24 1630        Post  Biometrics   Height 5' 6 (1.676 m)    Weight 66.4 kg    Waist Circumference 31.25 inches    Hip Circumference 36 inches    Waist to Hip Ratio 0.87 %    BMI (Calculated) 23.64    Triceps Skinfold 9 mm    % Body Fat 19.5 %    Grip Strength 33 kg    Flexibility 15 in    Single Leg Stand 28 seconds          Nutrition:  Nutrition Therapy & Goals - 06/23/24 1603       Nutrition Therapy   Diet Heart healthy diet    Drug/Food Interactions Statins/Certain Fruits      Personal Nutrition Goals   Nutrition Goal Patient to identify strategies for reducing cardiovascular risk by attending the [facility] education and nutrition series weekly.    Personal Goal #2 Patient to improve diet quality by using the plate method as a guide for meal planning to include lean protein/plant protein, fruits, vegetables, whole grains, nonfat dairy as part of a well-balanced diet.    Comments Patient has medical history of CAD s/p CABG x 4 , HTN. Lipid are at goal; he continues repatha , crestor , zetia . Patient will benefit from participation in intensive cardiac rehab for nutrition education, exercise, and lifestyle modification.      Intervention Plan   Intervention Prescribe, educate and counsel regarding individualized specific dietary modifications aiming towards targeted core components such as weight,  hypertension, lipid management, diabetes, heart failure and other comorbidities.;Nutrition handout(s) given to patient.    Expected Outcomes Short Term Goal: Understand basic principles of dietary content, such as calories, fat, sodium, cholesterol and nutrients.;Long Term Goal: Adherence to prescribed nutrition plan.          Nutrition Discharge:   Education Questionnaire Score:  Knowledge Questionnaire Score - 09/12/24 0859       Knowledge Questionnaire Score   Post Score --          Goals reviewed with patient; copy given to patient.Pt graduates from  Intensive/Traditional cardiac rehab program on 09/08/24  with completion of 59 exercise and education sessions. Pt maintained good attendance and progressed nicely during their participation in rehab as evidenced by increased MET level. Carter increased his distance on his post exercise walk test by 700 ft!  Pt has made significant lifestyle changes and should be commended for his success. Ezekeil achieved his goals  during cardiac rehab.   Pt plans to continue exercise walking, jogging using hand weights and going to the gym. Giavonni says that participating in cardiac rehab has been helpful for him. We are proud of Tyee's progress![name] RN BSN

## 2024-10-25 ENCOUNTER — Other Ambulatory Visit: Payer: Self-pay | Admitting: Cardiology
